# Patient Record
Sex: Female | Born: 1960
Health system: Southern US, Community
[De-identification: ages and names within clinical notes are randomized; demographics above are authoritative.]

## PROBLEM LIST (undated history)

## (undated) DIAGNOSIS — N649 Disorder of breast, unspecified: Secondary | ICD-10-CM

## (undated) DIAGNOSIS — F17200 Nicotine dependence, unspecified, uncomplicated: Secondary | ICD-10-CM

## (undated) DIAGNOSIS — T7840XA Allergy, unspecified, initial encounter: Secondary | ICD-10-CM

## (undated) DIAGNOSIS — R079 Chest pain, unspecified: Secondary | ICD-10-CM

## (undated) DIAGNOSIS — I4510 Unspecified right bundle-branch block: Secondary | ICD-10-CM

## (undated) DIAGNOSIS — R0683 Snoring: Secondary | ICD-10-CM

## (undated) DIAGNOSIS — E782 Mixed hyperlipidemia: Secondary | ICD-10-CM

## (undated) HISTORY — DX: Nicotine dependence, unspecified, uncomplicated: F17.200

## (undated) HISTORY — DX: Disorder of breast, unspecified: N64.9

## (undated) HISTORY — DX: Snoring: R06.83

## (undated) HISTORY — DX: Mixed hyperlipidemia: E78.2

## (undated) HISTORY — DX: Allergy, unspecified, initial encounter: T78.40XA

## (undated) HISTORY — PX: OTHER SURGICAL HISTORY: SHX169

## (undated) HISTORY — DX: Chest pain, unspecified: R07.9

---

## 2012-04-30 ENCOUNTER — Encounter (HOSPITAL_BASED_OUTPATIENT_CLINIC_OR_DEPARTMENT_OTHER): Payer: Self-pay | Admitting: *Deleted

## 2012-04-30 ENCOUNTER — Emergency Department (HOSPITAL_BASED_OUTPATIENT_CLINIC_OR_DEPARTMENT_OTHER)
Admission: EM | Admit: 2012-04-30 | Discharge: 2012-04-30 | Disposition: A | Discharge status: Home / Self Care | Payer: BC Managed Care – PPO | Attending: Emergency Medicine | Admitting: Emergency Medicine

## 2012-04-30 ENCOUNTER — Emergency Department (HOSPITAL_BASED_OUTPATIENT_CLINIC_OR_DEPARTMENT_OTHER): Payer: BC Managed Care – PPO

## 2012-04-30 DIAGNOSIS — S92919A Unspecified fracture of unspecified toe(s), initial encounter for closed fracture: Secondary | ICD-10-CM | POA: Insufficient documentation

## 2012-04-30 DIAGNOSIS — Y92009 Unspecified place in unspecified non-institutional (private) residence as the place of occurrence of the external cause: Secondary | ICD-10-CM | POA: Insufficient documentation

## 2012-04-30 DIAGNOSIS — F17200 Nicotine dependence, unspecified, uncomplicated: Secondary | ICD-10-CM | POA: Insufficient documentation

## 2012-04-30 DIAGNOSIS — Y9389 Activity, other specified: Secondary | ICD-10-CM | POA: Insufficient documentation

## 2012-04-30 DIAGNOSIS — W2203XA Walked into furniture, initial encounter: Secondary | ICD-10-CM | POA: Insufficient documentation

## 2012-04-30 DIAGNOSIS — I4510 Unspecified right bundle-branch block: Secondary | ICD-10-CM | POA: Insufficient documentation

## 2012-04-30 HISTORY — DX: Unspecified right bundle-branch block: I45.10

## 2012-04-30 MED ORDER — HYDROCODONE-ACETAMINOPHEN 5-325 MG PO TABS
1.0000 | ORAL_TABLET | Freq: Once | ORAL | Status: AC
  Administered 2012-04-30: 1 via ORAL
  Filled 2012-04-30: qty 1

## 2012-04-30 MED ORDER — HYDROCODONE-ACETAMINOPHEN 5-325 MG PO TABS: 1.0000 | ORAL_TABLET | Freq: Four times a day (QID) | ORAL | Status: DC | PRN

## 2012-04-30 NOTE — ED Provider Notes (Signed)
History     CSN: [id number]  Arrival date & time 04/30/12  0610   First MD Initiated Contact with Patient 04/30/12 [phone]      Chief Complaint  Patient presents with  . Toe Injury    (Consider location/radiation/quality/duration/timing/severity/associated sxs/prior treatment) HPI This is a 51 year old female who got a bed this morning to go work and accidentally kicked a foot stool. There was immediate, severe pain in her left fourth toe which is now angled lateral to its natural position. She had transient lightheadedness and diaphoresis due to the pain. There is still moderate pain in that toe and on the base of the foot proximal to that toe, worse with movement or palpation. She denies other injury.  Past Medical History  Diagnosis Date  . Right bundle branch block     Past Surgical History  Procedure Date  . Pylonidal cystectomy     No family history on file.  History  Substance Use Topics  . Smoking status: Current Some Day Smoker    Types: Cigarettes  . Smokeless tobacco: Never Used  . Alcohol Use: Yes     1 beer once a month    OB History    Grav Para Term Preterm Abortions TAB SAB Ect Mult Living                  Review of Systems  All other systems reviewed and are negative.    Allergies  Penicillins  Home Medications  No current outpatient prescriptions on file.  BP 130/73  Pulse 73  Temp 98 F (36.7 C) (Oral)  Resp 16  Ht 5\' 6"  (1.676 m)  Wt 145 lb (65.772 kg)  BMI 23.40 kg/m2  SpO2 100%  Physical Exam General: Well-developed, well-nourished female in no acute distress; appearance consistent with age of record HENT: normocephalic, atraumatic Eyes: pupils equal round and reactive to light; extraocular muscles intact Neck: supple Heart: regular rate and rhythm Lungs: clear to auscultation bilaterally Abdomen: soft; nondistended; nontender; bowel sounds present Extremities: Deformity of left fourth toe, angled laterally; sensation of  distal left fourth toe intact but altered; brisk capillary refill distal left fourth toe; tenderness of left fourth toe and plantar left foot proximal left fourth toe; pulses normal Neurologic: Awake, alert and oriented; motor function intact in all extremities and symmetric; no facial droop Skin: Warm and dry Psychiatric: Normal mood and affect    ED Course  Procedures (including critical care time)  CLOSED REDUCTION The patient's left fourth toe proximal phalanx fracture was reduced with traction to near anatomic alignment. The toe was then buddy taped to the left third toe. The patient tolerated this well and there were no immediate complications.   MDM  Nursing notes and vitals signs, including pulse oximetry, reviewed.  Summary of this visit's results, reviewed by myself:   Imaging Studies: Dg Foot Complete Left  04/30/2012  *RADIOLOGY REPORT*  Clinical Data: Injury, pain.  LEFT FOOT - COMPLETE 3+ VIEW  Comparison: None.  Findings: The patient has a fracture through the proximal diaphysis of the proximal phalanx of the fourth toe with lateral angulation. No other acute bony or joint abnormality is identified. Sclerotic lesion in the distal phalanx of the great toe has benign features and may be a bone island.  IMPRESSION: Fracture proximal phalanx of the fourth toe.   Original Report Authenticated By: Bernadene Bell. Maricela Curet, M.D.             Hanley Seamen, MD 04/30/12  0653 

## 2012-04-30 NOTE — ED Notes (Signed)
Pt states she got out of bed to go to work and hit left foot on leg of a foot stool. 4 digit appears misaligned with other toes and is red in appearance. Pt reports foot pain at base of toes and on ball of foot. Pt reports pain 8/10 at this time. Good pedal pulse and capillary refill.

## 2013-02-27 ENCOUNTER — Ambulatory Visit (HOSPITAL_BASED_OUTPATIENT_CLINIC_OR_DEPARTMENT_OTHER)
Admission: RE | Admit: 2013-02-27 | Discharge: 2013-02-27 | Disposition: A | Discharge status: Home / Self Care | Payer: BC Managed Care – PPO | Source: Ambulatory Visit | Attending: Physician Assistant | Admitting: Physician Assistant

## 2013-02-27 ENCOUNTER — Ambulatory Visit (INDEPENDENT_AMBULATORY_CARE_PROVIDER_SITE_OTHER): Payer: BC Managed Care – PPO | Admitting: Physician Assistant

## 2013-02-27 ENCOUNTER — Encounter: Payer: Self-pay | Admitting: Physician Assistant

## 2013-02-27 VITALS — BP 124/84 | HR 76 | Temp 97.8°F | Resp 16 | Ht 66.0 in | Wt 151.2 lb

## 2013-02-27 DIAGNOSIS — R350 Frequency of micturition: Secondary | ICD-10-CM

## 2013-02-27 DIAGNOSIS — M79609 Pain in unspecified limb: Secondary | ICD-10-CM | POA: Insufficient documentation

## 2013-02-27 DIAGNOSIS — M5412 Radiculopathy, cervical region: Secondary | ICD-10-CM

## 2013-02-27 DIAGNOSIS — R200 Anesthesia of skin: Secondary | ICD-10-CM

## 2013-02-27 DIAGNOSIS — M542 Cervicalgia: Secondary | ICD-10-CM | POA: Insufficient documentation

## 2013-02-27 DIAGNOSIS — R202 Paresthesia of skin: Secondary | ICD-10-CM

## 2013-02-27 DIAGNOSIS — M25519 Pain in unspecified shoulder: Secondary | ICD-10-CM | POA: Insufficient documentation

## 2013-02-27 DIAGNOSIS — R209 Unspecified disturbances of skin sensation: Secondary | ICD-10-CM

## 2013-02-27 DIAGNOSIS — Z8679 Personal history of other diseases of the circulatory system: Secondary | ICD-10-CM

## 2013-02-27 DIAGNOSIS — Z1239 Encounter for other screening for malignant neoplasm of breast: Secondary | ICD-10-CM | POA: Insufficient documentation

## 2013-02-27 DIAGNOSIS — Z0000 Encounter for general adult medical examination without abnormal findings: Secondary | ICD-10-CM

## 2013-02-27 MED ORDER — NITROGLYCERIN 0.4 MG SL SUBL: 0.4000 mg | SUBLINGUAL_TABLET | SUBLINGUAL | Status: DC | PRN

## 2013-02-27 NOTE — Assessment & Plan Note (Addendum)
EKG today shows NSR with rate of 69, with no abnormalities.  BP, pulse and O2 saturation all great at today's visit.  Refill Nitro SL.  Obtained signature for medical record release from PCP in [place] and Development worker, international aid in [place].  Patient educated on red flag symptoms and when to present to the ED.

## 2013-02-27 NOTE — Assessment & Plan Note (Signed)
Will check UA with microscopy, BMP and A1C today.

## 2013-02-27 NOTE — Assessment & Plan Note (Signed)
Xray cervical spine.  Encourage ibuprofen/aleve for symptoms.  Topical aspercreme to neck.  Patient to return in 2 weeks if symptoms are persisting.

## 2013-02-27 NOTE — Patient Instructions (Addendum)
Please obtain fasting labs.  We will call you with the results.  Please continue taking anaprox for left arm pain.  Also consider topical aspercreme to neck and shoulders.  Try to avoid overexertion of left shoulder.  If symptoms persist, we will need to reassess and do a further workup.  I want to see you in 3 weeks   Cervical Radiculopathy Cervical radiculopathy happens when a nerve in the neck is pinched or bruised by a slipped (herniated) disk or by arthritic changes in the bones of the cervical spine. This can occur due to an injury or as part of the normal aging process. Pressure on the cervical nerves can cause pain or numbness that runs from your neck all the way down into your arm and fingers. CAUSES  There are many possible causes, including:  Injury.  Muscle tightness in the neck from overuse.  Swollen, painful joints (arthritis).  Breakdown or degeneration in the bones and joints of the spine (spondylosis) due to aging.  Bone spurs that may develop near the cervical nerves. SYMPTOMS  Symptoms include pain, weakness, or numbness in the affected arm and hand. Pain can be severe or irritating. Symptoms may be worse when extending or turning the neck. DIAGNOSIS  Your caregiver will ask about your symptoms and do a physical exam. He or she may test your strength and reflexes. X-rays, CT scans, and MRI scans may be needed in cases of injury or if the symptoms do not go away after a period of time. Electromyography (EMG) or nerve conduction testing may be done to study how your nerves and muscles are working. TREATMENT  Your caregiver may recommend certain exercises to help relieve your symptoms. Cervical radiculopathy can, and often does, get better with time and treatment. If your problems continue, treatment options may include:  Wearing a soft collar for short periods of time.  Physical therapy to strengthen the neck muscles.  Medicines, such as nonsteroidal anti-inflammatory  drugs (NSAIDs), oral corticosteroids, or spinal injections.  Surgery. Different types of surgery may be done depending on the cause of your problems. HOME CARE INSTRUCTIONS   Put ice on the affected area.  Put ice in a plastic bag.  Place a towel between your skin and the bag.  Leave the ice on for 15-20 minutes, 3-4 times a day or as directed by your caregiver.  If ice does not help, you can try using heat. Take a warm shower or bath, or use a hot water bottle as directed by your caregiver.  You may try a gentle neck and shoulder massage.  Use a flat pillow when you sleep.  Only take over-the-counter or prescription medicines for pain, discomfort, or fever as directed by your caregiver.  If physical therapy was prescribed, follow your caregiver's directions.  If a soft collar was prescribed, use it as directed. SEEK IMMEDIATE MEDICAL CARE IF:   Your pain gets much worse and cannot be controlled with medicines.  You have weakness or numbness in your hand, arm, face, or leg.  You have a high fever or a stiff, rigid neck.  You lose bowel or bladder control (incontinence).  You have trouble with walking, balance, or speaking. MAKE SURE YOU:   Understand these instructions.  Will watch your condition.  Will get help right away if you are not doing well or get worse. Document Released: 03/14/2001 Document Revised: 09/11/2011 Document Reviewed: 01/31/2011 [facility] Patient Information 2014 [place], [place].

## 2013-02-27 NOTE — Progress Notes (Signed)
Patient ID: Molly Snyder, female   DOB: April 10, 1961, 52 y.o.   MRN: [id number]   Patient presents to clinic today to establish care.  Information was obtained from the patient.    Acute Concerns:   Patient c/o pain in left shoulder and arm x 2 weeks, described as a shooting pain that travels from neck down arm, with occasional tingling/numbness.  Endorses some fatigue.  Patient endorses occupation involving intense physical labor.  Patient denies similar symptoms of RUE.  Patient has history of multiple slipped discs of cervical vertebra.  Patient also endorses history of RBBB on prior EKG and "angina" diagnosed by cardiologist 5 years ago.  Patient states cardiology performed a full work-up including echocardiogram and heart catheterization.  No there abnormalities found, according to patient.  Denies history of MI, CVA or TIA.  Denies hx of hypertension.  States she has a history of high cholesterol but denies ever taking medication for this.  Denies overt chest pain, but endorses some palpations, which she thinks are attributable to concern over neck and arm pain.  Has nitroglycerin SL for her history of angina.  Has taken one of these for her pain, but states it does not relieve pain.  Patient denies history of diabetes but does endorse urinary frequency.  Denies urgency, hematuria, flank pain, dysuria, foul-smelling urine.  Has taken some aleve for her arm pain with some relief of symptoms.  No other concerns at present.  Chronic Issues: Patient endorses history of hyperlipidemia in the past.  Endorses history of RBBB and Angina.  Last work-up 5 years ago.  Health Maintenance: Eyes -- No exam in several years.   Dental -- No exam in several years PAP -- last exam 3+ years ago.  Denies history of abnormal pap smear. Mammography -- unsure of mammograms Immunizations -- patient states she is up-to-date on shots. Last tetanus was 3 years ago.  Past Medical History  Diagnosis Date  . Right  bundle branch block     No current outpatient prescriptions on file prior to visit.   No current facility-administered medications on file prior to visit.    Allergies  Allergen Reactions  . Penicillins Anaphylaxis    History reviewed. No pertinent family history.  History   Social History  . Marital Status: Single    Spouse Name: N/A    Number of Children: N/A  . Years of Education: N/A   Social History Main Topics  . Smoking status: Current Some Day Smoker    Types: Cigarettes  . Smokeless tobacco: Never Used  . Alcohol Use: Yes     Comment: 1 beer once a month  . Drug Use: No  . Sexual Activity: Yes    Birth Control/ Protection: None   Other Topics Concern  . None   Social History Narrative  . None    Review of Systems  Constitutional: Negative for fever, chills, weight loss and malaise/fatigue.  HENT: Positive for neck pain. Negative for hearing loss, ear pain, tinnitus and ear discharge.   Eyes: Positive for blurred vision. Negative for double vision, photophobia and pain.  Respiratory: Negative for cough, shortness of breath and wheezing.   Cardiovascular: Positive for palpitations. Negative for chest pain.  Gastrointestinal: Positive for heartburn and nausea. Negative for vomiting, abdominal pain, diarrhea, constipation and blood in stool.  Genitourinary: Positive for frequency. Negative for dysuria, urgency, hematuria and flank pain.  Musculoskeletal: Positive for back pain. Negative for myalgias and joint pain.  Neurological: Positive for  tingling and headaches. Negative for seizures and loss of consciousness.  Psychiatric/Behavioral: Negative for depression, suicidal ideas and substance abuse. The patient is nervous/anxious.    Filed Vitals:   02/27/13 0950  BP: 124/84  Pulse: 76  Temp: 97.8 F (36.6 C)  Resp: 16   Physical Exam  Vitals reviewed. Constitutional: She is oriented to person, place, and time and well-developed, well-nourished, and  in no distress.  HENT:  Head: Normocephalic and atraumatic.  Right Ear: External ear normal.  Left Ear: External ear normal.  Nose: Nose normal.  Mouth/Throat: Oropharynx is clear and moist. No oropharyngeal exudate.  TM WNL bilaterally  Eyes: Conjunctivae and EOM are normal. Pupils are equal, round, and reactive to light.  Neck: Normal range of motion. Neck supple. No thyromegaly present.  Cardiovascular: Normal rate, regular rhythm, normal heart sounds and intact distal pulses.   Pulmonary/Chest: Effort normal and breath sounds normal. No respiratory distress. She has no wheezes. She has no rales. She exhibits no tenderness.  Abdominal: Soft. Bowel sounds are normal. She exhibits no distension and no mass. There is no tenderness. There is no rebound and no guarding.  Musculoskeletal: She exhibits tenderness.  Internal rotation of R shoulder limited 2/2 prior surgery.  ROM and strength of RUE otherwise normal. ROM reproduces pain. No bony tenderness or abnormality.    Lymphadenopathy:    She has no cervical adenopathy.  Neurological: She is alert and oriented to person, place, and time. She has normal reflexes. No cranial nerve deficit.  Skin: Skin is warm and dry. No rash noted.   Diagnostics: EKG --  NSR with rate of 69; no STEMI or other abnormalities  Assessment/Plan: History of angina EKG today shows NSR with rate of 69, with no abnormalities.  BP, pulse and O2 saturation all great at today's visit.  Refill Nitro SL.  Obtained signature for medical record release from PCP in [place] and Development worker, international aid in [place].  Patient educated on red flag symptoms and when to present to the ED.  Visit for preventive health examination Patient overdue for mammography, PAP smear, dental and eye examination.  Patient educated on importance of these measures.  Plans to have these in the future.  Wishes to have [place] handle her gynecological care.  Obtain fasting labs at today's visit.  Obtain medical records  from prior PCP and Cardiologist.  Left cervical radiculopathy Xray cervical spine.  Encourage ibuprofen/aleve for symptoms.  Topical aspercreme to neck.  Patient to return in 2 weeks if symptoms are persisting.  Urinary frequency Will check UA with microscopy, BMP and A1C today.

## 2013-02-27 NOTE — Assessment & Plan Note (Addendum)
Patient overdue for mammography, PAP smear, dental and eye examination.  Patient educated on importance of these measures.  Plans to have these in the future.  Wishes to have [place] handle her gynecological care.  Obtain fasting labs at today's visit.  Obtain medical records from prior PCP and Cardiologist.

## 2013-02-28 ENCOUNTER — Other Ambulatory Visit: Payer: Self-pay | Admitting: Physician Assistant

## 2013-02-28 ENCOUNTER — Telehealth: Payer: Self-pay | Admitting: *Deleted

## 2013-02-28 ENCOUNTER — Ambulatory Visit: Payer: BC Managed Care – PPO | Admitting: Family

## 2013-02-28 DIAGNOSIS — E785 Hyperlipidemia, unspecified: Secondary | ICD-10-CM

## 2013-02-28 LAB — CBC WITH DIFFERENTIAL/PLATELET
Basophils Absolute: 0.1 10*3/uL (ref 0.0–0.1)
Basophils Relative: 1 % (ref 0–1)
Eosinophils Absolute: 0.3 10*3/uL (ref 0.0–0.7)
Eosinophils Relative: 3 % (ref 0–5)
Lymphocytes Relative: 30 % (ref 12–46)
MCH: 29.2 pg (ref 26.0–34.0)
MCHC: 33.2 g/dL (ref 30.0–36.0)
MCV: 88.1 fL (ref 78.0–100.0)
Platelets: 409 10*3/uL — ABNORMAL HIGH (ref 150–400)
RDW: 13.9 % (ref 11.5–15.5)
WBC: 9.4 10*3/uL (ref 4.0–10.5)

## 2013-02-28 LAB — URINALYSIS, ROUTINE W REFLEX MICROSCOPIC
Glucose, UA: NEGATIVE mg/dL
Hgb urine dipstick: NEGATIVE
Ketones, ur: NEGATIVE mg/dL
Leukocytes, UA: NEGATIVE
Nitrite: NEGATIVE
Specific Gravity, Urine: 1.006 (ref 1.005–1.030)
pH: 6.5 (ref 5.0–8.0)

## 2013-02-28 LAB — LIPID PANEL
Cholesterol: 321 mg/dL — ABNORMAL HIGH (ref 0–200)
Total CHOL/HDL Ratio: 5.8 Ratio
Triglycerides: 198 mg/dL — ABNORMAL HIGH (ref <150)
VLDL: 40 mg/dL (ref 0–40)

## 2013-02-28 LAB — HEPATIC FUNCTION PANEL
Bilirubin, Direct: 0.1 mg/dL (ref 0.0–0.3)
Indirect Bilirubin: 0.3 mg/dL (ref 0.0–0.9)
Total Bilirubin: 0.4 mg/dL (ref 0.3–1.2)

## 2013-02-28 LAB — BASIC METABOLIC PANEL
BUN: 15 mg/dL (ref 6–23)
Calcium: 9.9 mg/dL (ref 8.4–10.5)
Creat: 0.76 mg/dL (ref 0.50–1.10)

## 2013-02-28 LAB — TSH: TSH: 0.365 u[IU]/mL (ref 0.350–4.500)

## 2013-02-28 MED ORDER — ATORVASTATIN CALCIUM 20 MG PO TABS: 20.0000 mg | ORAL_TABLET | Freq: Every day | ORAL | Status: DC

## 2013-02-28 NOTE — Telephone Encounter (Signed)
Notified pt of instructions below. She states her "cholesterol is genetic and she is not going to take cholesterol medication". States "they have wanted me on cholesterol medication since I was 17". Reports that she eats" healthy, limits fried foods, no sweets, junk or snacks and has a very physical job. Pt voices understanding of results and will call for appt if pain continues after a couple of weeks.

## 2013-02-28 NOTE — Telephone Encounter (Signed)
LMOM with contact name and number for return call RE: results and further provider instructions/SLS  

## 2013-02-28 NOTE — Telephone Encounter (Signed)
Message copied by Regis Bill on Fri Feb 28, 2013 11:57 AM ------      Message from: Marcelline Mates      Created: Fri Feb 28, 2013  8:07 AM       Please inform patient that her lab results are in.  Overall labs look good.  However, her LDL (lousy) cholesterol is high and therefore we need to start medication for this as well as lifestyle changes.  For medication, I want to start atorvastatin (lipitor) 20 mg daily.  Most common side effects of this type of medication that she should look out for are muscle aches, joint aches and headaches.  She also needs to implement diet/exercise to her daily life.  Dietary recommendations include an intake of saturated fat <7% of total daily calories, smoking cessation, and exercise of at least 30 minutes a few times each week. The intake of cholesterol from food should be limited to <300 mg/day.  I will need to see her again in 1-2 months from starting her medication to recheck her cholesterol and liver function to see if we need to make a dose increase.  It is very important that we keep her cholesterol under control with her history of angina as high cholesterol increases her risk of      heart attack and stroke.            Also, please inform patient that her X-ray did not show any major degenerative changes to the bones in her neck.  She is to continue our current plan for pain relief.  If symptoms persist past a couple of weeks, then she should schedule a return visit, as further imaging may be needed. ------

## 2013-03-04 NOTE — Telephone Encounter (Deleted)
Called patient personally to

## 2013-03-04 NOTE — Telephone Encounter (Signed)
Patient was educated at visit on the importance of cholesterol and blood pressure management in the prevention of heart attack and stroke.  Patient endorsed understanding and was amenable to therapy if she had abnormal lab results.  Patient is aware of her family history and personal history of angina.  She currently refuses medication for her cholesterol.  Patient is a 52 year-old adult with adequate decision-making capacity.  I will attempt to persuade her to start therapy at her next visit(s).

## 2013-03-20 ENCOUNTER — Ambulatory Visit: Payer: BC Managed Care – PPO | Admitting: Physician Assistant

## 2014-09-01 ENCOUNTER — Ambulatory Visit (INDEPENDENT_AMBULATORY_CARE_PROVIDER_SITE_OTHER): Payer: BLUE CROSS/BLUE SHIELD | Admitting: Family Medicine

## 2014-09-01 ENCOUNTER — Encounter: Payer: Self-pay | Admitting: Family Medicine

## 2014-09-01 VITALS — BP 124/80 | HR 91 | Temp 98.3°F | Ht 66.0 in | Wt 151.2 lb

## 2014-09-01 DIAGNOSIS — R079 Chest pain, unspecified: Secondary | ICD-10-CM

## 2014-09-01 DIAGNOSIS — N649 Disorder of breast, unspecified: Secondary | ICD-10-CM

## 2014-09-01 DIAGNOSIS — F17200 Nicotine dependence, unspecified, uncomplicated: Secondary | ICD-10-CM

## 2014-09-01 DIAGNOSIS — Z720 Tobacco use: Secondary | ICD-10-CM

## 2014-09-01 DIAGNOSIS — G4730 Sleep apnea, unspecified: Secondary | ICD-10-CM

## 2014-09-01 DIAGNOSIS — F329 Major depressive disorder, single episode, unspecified: Secondary | ICD-10-CM

## 2014-09-01 DIAGNOSIS — R002 Palpitations: Secondary | ICD-10-CM

## 2014-09-01 DIAGNOSIS — F32A Depression, unspecified: Secondary | ICD-10-CM

## 2014-09-01 DIAGNOSIS — R0683 Snoring: Secondary | ICD-10-CM

## 2014-09-01 DIAGNOSIS — F419 Anxiety disorder, unspecified: Secondary | ICD-10-CM

## 2014-09-01 DIAGNOSIS — R5382 Chronic fatigue, unspecified: Secondary | ICD-10-CM

## 2014-09-01 DIAGNOSIS — R519 Headache, unspecified: Secondary | ICD-10-CM

## 2014-09-01 DIAGNOSIS — E782 Mixed hyperlipidemia: Secondary | ICD-10-CM

## 2014-09-01 DIAGNOSIS — F418 Other specified anxiety disorders: Secondary | ICD-10-CM

## 2014-09-01 DIAGNOSIS — R51 Headache: Secondary | ICD-10-CM

## 2014-09-01 NOTE — Progress Notes (Signed)
Pre visit review using our clinic review tool, if applicable. No additional management support is needed unless otherwise documented below in the visit note. 

## 2014-09-01 NOTE — Patient Instructions (Signed)
Sleep Apnea  Sleep apnea is a sleep disorder characterized by abnormal pauses in breathing while you sleep. When your breathing pauses, the level of oxygen in your blood decreases. This causes you to move out of deep sleep and into light sleep. As a result, your quality of sleep is poor, and the system that carries your blood throughout your body (cardiovascular system) experiences stress. If sleep apnea remains untreated, the following conditions can develop:  High blood pressure (hypertension).  Coronary artery disease.  Inability to achieve or maintain an erection (impotence).  Impairment of your thought process (cognitive dysfunction). There are three types of sleep apnea: 1. Obstructive sleep apnea--Pauses in breathing during sleep because of a blocked airway. 2. Central sleep apnea--Pauses in breathing during sleep because the area of the brain that controls your breathing does not send the correct signals to the muscles that control breathing. 3. Mixed sleep apnea--A combination of both obstructive and central sleep apnea. RISK FACTORS The following risk factors can increase your risk of developing sleep apnea:  Being overweight.  Smoking.  Having narrow passages in your nose and throat.  Being of older age.  Being female.  Alcohol use.  Sedative and tranquilizer use.  Ethnicity. Among individuals younger than 35 years, African Americans are at increased risk of sleep apnea. SYMPTOMS   Difficulty staying asleep.  Daytime sleepiness and fatigue.  Loss of energy.  Irritability.  Loud, heavy snoring.  Morning headaches.  Trouble concentrating.  Forgetfulness.  Decreased interest in sex. DIAGNOSIS  In order to diagnose sleep apnea, your caregiver will perform a physical examination. Your caregiver may suggest that you take a home sleep test. Your caregiver may also recommend that you spend the night in a sleep lab. In the sleep lab, several monitors record  information about your heart, lungs, and brain while you sleep. Your leg and arm movements and blood oxygen level are also recorded. TREATMENT The following actions may help to resolve mild sleep apnea:  Sleeping on your side.   Using a decongestant if you have nasal congestion.   Avoiding the use of depressants, including alcohol, sedatives, and narcotics.   Losing weight and modifying your diet if you are overweight. There also are devices and treatments to help open your airway:  Oral appliances. These are custom-made mouthpieces that shift your lower jaw forward and slightly open your bite. This opens your airway.  Devices that create positive airway pressure. This positive pressure "splints" your airway open to help you breathe better during sleep. The following devices create positive airway pressure:  Continuous positive airway pressure (CPAP) device. The CPAP device creates a continuous level of air pressure with an air pump. The air is delivered to your airway through a mask while you sleep. This continuous pressure keeps your airway open.  Nasal expiratory positive airway pressure (EPAP) device. The EPAP device creates positive air pressure as you exhale. The device consists of single-use valves, which are inserted into each nostril and held in place by adhesive. The valves create very little resistance when you inhale but create much more resistance when you exhale. That increased resistance creates the positive airway pressure. This positive pressure while you exhale keeps your airway open, making it easier to breath when you inhale again.  Bilevel positive airway pressure (BPAP) device. The BPAP device is used mainly in patients with central sleep apnea. This device is similar to the CPAP device because it also uses an air pump to deliver continuous air pressure   through a mask. However, with the BPAP machine, the pressure is set at two different levels. The pressure when you  exhale is lower than the pressure when you inhale.  Surgery. Typically, surgery is only done if you cannot comply with less invasive treatments or if the less invasive treatments do not improve your condition. Surgery involves removing excess tissue in your airway to create a wider passage way. Document Released: 06/09/2002 Document Revised: 10/14/2012 Document Reviewed: 10/26/2011 [employer] Patient Information 2015 [employer]. This information is not intended to replace advice given to you by your health care provider. Make sure you discuss any questions you have with your health care provider.  

## 2014-09-02 ENCOUNTER — Other Ambulatory Visit (INDEPENDENT_AMBULATORY_CARE_PROVIDER_SITE_OTHER): Payer: BLUE CROSS/BLUE SHIELD

## 2014-09-02 ENCOUNTER — Other Ambulatory Visit: Payer: Self-pay | Admitting: Family Medicine

## 2014-09-02 ENCOUNTER — Encounter: Payer: Self-pay | Admitting: Family Medicine

## 2014-09-02 DIAGNOSIS — N649 Disorder of breast, unspecified: Secondary | ICD-10-CM

## 2014-09-02 DIAGNOSIS — R079 Chest pain, unspecified: Secondary | ICD-10-CM

## 2014-09-02 DIAGNOSIS — R0683 Snoring: Secondary | ICD-10-CM

## 2014-09-02 DIAGNOSIS — R519 Headache, unspecified: Secondary | ICD-10-CM

## 2014-09-02 DIAGNOSIS — R002 Palpitations: Secondary | ICD-10-CM

## 2014-09-02 DIAGNOSIS — F17200 Nicotine dependence, unspecified, uncomplicated: Secondary | ICD-10-CM

## 2014-09-02 DIAGNOSIS — F329 Major depressive disorder, single episode, unspecified: Secondary | ICD-10-CM | POA: Insufficient documentation

## 2014-09-02 DIAGNOSIS — E782 Mixed hyperlipidemia: Secondary | ICD-10-CM

## 2014-09-02 DIAGNOSIS — G4730 Sleep apnea, unspecified: Secondary | ICD-10-CM

## 2014-09-02 DIAGNOSIS — I2510 Atherosclerotic heart disease of native coronary artery without angina pectoris: Secondary | ICD-10-CM | POA: Insufficient documentation

## 2014-09-02 DIAGNOSIS — F419 Anxiety disorder, unspecified: Secondary | ICD-10-CM

## 2014-09-02 DIAGNOSIS — R51 Headache: Secondary | ICD-10-CM

## 2014-09-02 DIAGNOSIS — F32A Depression, unspecified: Secondary | ICD-10-CM | POA: Insufficient documentation

## 2014-09-02 DIAGNOSIS — R5382 Chronic fatigue, unspecified: Secondary | ICD-10-CM

## 2014-09-02 DIAGNOSIS — E785 Hyperlipidemia, unspecified: Secondary | ICD-10-CM

## 2014-09-02 HISTORY — DX: Nicotine dependence, unspecified, uncomplicated: F17.200

## 2014-09-02 HISTORY — DX: Snoring: R06.83

## 2014-09-02 HISTORY — DX: Disorder of breast, unspecified: N64.9

## 2014-09-02 HISTORY — DX: Chest pain, unspecified: R07.9

## 2014-09-02 LAB — CBC
HCT: 40.5 % (ref 36.0–46.0)
Hemoglobin: 13.7 g/dL (ref 12.0–15.0)
MCHC: 33.8 g/dL (ref 30.0–36.0)
MCV: 88.4 fl (ref 78.0–100.0)
Platelets: 368 10*3/uL (ref 150.0–400.0)
RBC: 4.58 Mil/uL (ref 3.87–5.11)
RDW: 14.3 % (ref 11.5–15.5)
WBC: 8.7 10*3/uL (ref 4.0–10.5)

## 2014-09-02 LAB — LIPID PANEL
CHOL/HDL RATIO: 5
Cholesterol: 288 mg/dL — ABNORMAL HIGH (ref 0–200)
HDL: 56.8 mg/dL (ref >39.00)
LDL CALC: 209 mg/dL — AB (ref 0–99)
NONHDL: 231.2
Triglycerides: 112 mg/dL (ref 0.0–149.0)
VLDL: 22.4 mg/dL (ref 0.0–40.0)

## 2014-09-02 LAB — TSH: TSH: 0.8 u[IU]/mL (ref 0.35–4.50)

## 2014-09-02 NOTE — Assessment & Plan Note (Signed)
Has not had a MGM for greater than 10 years and is noting some swelling and pain in left axillae, is set up for mgm.

## 2014-09-02 NOTE — Assessment & Plan Note (Signed)
Encouraged complete cessation. Discussed need to quit as relates to risk of numerous cancers, cardiac and pulmonary disease as well as neurologic complications. Counseled for greater than 3 minutes 

## 2014-09-02 NOTE — Assessment & Plan Note (Addendum)
She reports witnessed apnea, heavy snoring, poor and restless sleep. She also notes excessive fatigue and headaches. Is set up for sleep study

## 2014-09-02 NOTE — Assessment & Plan Note (Addendum)
Has noted an escalation of palpitations and chest pain recently. Is referred to cardiology for further consideration and start an 81 mg ECASA daily

## 2014-09-02 NOTE — Assessment & Plan Note (Signed)
Declines meds, denies suicidal ideation.

## 2014-09-02 NOTE — Progress Notes (Signed)
Molly Snyder  [id number] Jul 10, 1960 09/02/2014      Progress Note-Follow Up  Subjective  Chief Complaint  Chief Complaint  Patient presents with  . Palpitations  . Fatigue  . Headache    HPI  Patient is a 54 y.o. female in today for routine medical care. Patient in today with numerous complaints. She has not been in in quite some time, well over a year and has only been seen in our off ice once previously. Does acknowledge a great deal of stress with changes in her work situation. 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 She is noting some increase in palpitations and chest pain as a result. Is also complaining of fullness in her left axilla. Has not had a mammogram in roughly 15 years. Continues to smoke. Denies any recent illness.  Denies CP/palp/SOB/HA/congestion/fevers/GI or GU c/o. Taking meds as prescribed Past Medical History  Diagnosis Date  . Right bundle branch block   . Breast lesion 09/02/2014    Left axillae   . Chest pain 09/02/2014  . Snoring 09/02/2014  . Tobacco use disorder 09/02/2014    Past Surgical History  Procedure Laterality Date  . Pylonidal cystectomy      History reviewed. No pertinent family history.  History   Social History  . Marital Status: Single    Spouse Name: N/A  . Number of Children: N/A  . Years of Education: N/A   Occupational History  . Not on file.   Social History Main Topics  . Smoking status: Current Some Day Smoker    Types: Cigarettes  . Smokeless tobacco: Never Used  . Alcohol Use: Yes     Comment: 1 beer once a month  . Drug Use: No  . Sexual Activity: Yes    Birth Control/ Protection: None   Other Topics Concern  . Not on file   Social History Narrative    Current Outpatient Prescriptions on File Prior to Visit  Medication Sig Dispense Refill  . loratadine (CLARITIN) 10 MG tablet Take 10 mg by mouth daily.    .  naproxen sodium (ANAPROX) 220 MG tablet Take 220 mg by mouth 2 (two) times daily with a meal.    . nitroGLYCERIN (NITROSTAT) 0.4 MG SL tablet Place 1 tablet (0.4 mg total) under the tongue every 5 (five) minutes as needed for chest pain. 30 tablet 0   No current facility-administered medications on file prior to visit.    Allergies  Allergen Reactions  . Penicillins Anaphylaxis    Review of Systems  Review of Systems  Constitutional: Negative for fever and malaise/fatigue.  HENT: Negative for congestion.   Eyes: Negative for discharge.  Respiratory: Negative for shortness of breath.   Cardiovascular: Negative for chest pain, palpitations and leg swelling.  Gastrointestinal: Negative for nausea, abdominal pain and diarrhea.  Genitourinary: Negative for dysuria.  Musculoskeletal: Positive for joint pain. Negative for falls.  Skin: Negative for rash.  Neurological: Negative for loss of consciousness and headaches.  Endo/Heme/Allergies: Negative for polydipsia.  Psychiatric/Behavioral: Negative for depression and suicidal ideas. The patient is not nervous/anxious and does not have insomnia.     Objective  BP 124/80 mmHg  Pulse 91  Temp(Src) 98.3 F (36.8 C) (Oral)  Ht 5\' 6"  (1.676 m)  Wt 151 lb 4 oz (68.607 kg)  BMI 24.42 kg/m2  SpO2 97%  Physical Exam  Physical Exam  Constitutional: She is oriented to person, place, and time and well-developed, well-nourished, and in no distress. No distress.  HENT:  Head: Normocephalic and atraumatic.  Right Ear: External ear normal.  Left Ear: External ear normal.  Nose: Nose normal.  Mouth/Throat: Oropharynx is clear and moist. No oropharyngeal exudate.  Eyes: Conjunctivae are normal. Pupils are equal, round, and reactive to light. Right eye exhibits no discharge. Left eye exhibits no discharge. No scleral icterus.  Neck: Normal range of motion. Neck supple. No thyromegaly present.  Cardiovascular: Normal rate, regular rhythm,  normal heart sounds and intact distal pulses.   No murmur heard. Pulmonary/Chest: Effort normal and breath sounds normal. No respiratory distress. She has no wheezes. She has no rales.  Abdominal: Soft. Bowel sounds are normal. She exhibits no distension and no mass. There is no tenderness.  Musculoskeletal: Normal range of motion. She exhibits no edema or tenderness.  Lymphadenopathy:    She has no cervical adenopathy.  Neurological: She is alert and oriented to person, place, and time. She has normal reflexes. No cranial nerve deficit. Coordination normal.  Skin: Skin is warm and dry. No rash noted. She is not diaphoretic.  Psychiatric: Mood, memory and affect normal.    Lab Results  Component Value Date   TSH 0.80 09/02/2014   Lab Results  Component Value Date   WBC 8.7 09/02/2014   HGB 13.7 09/02/2014   HCT 40.5 09/02/2014   MCV 88.4 09/02/2014   PLT 368.0 09/02/2014   Lab Results  Component Value Date   CREATININE 0.76 02/27/2013   BUN 15 02/27/2013   NA 138 02/27/2013   K 4.4 02/27/2013   CL 105 02/27/2013   CO2 27 02/27/2013   Lab Results  Component Value Date   ALT 11 02/27/2013   AST 13 02/27/2013   ALKPHOS 64 02/27/2013   BILITOT 0.4 02/27/2013   Lab Results  Component Value Date   CHOL 288* 09/02/2014   Lab Results  Component Value Date   HDL 56.80 09/02/2014   Lab Results  Component Value Date   LDLCALC 209* 09/02/2014   Lab Results  Component Value Date   TRIG 112.0 09/02/2014   Lab Results  Component Value Date   CHOLHDL 5 09/02/2014     Assessment & Plan  Snoring She reports witnessed apnea, heavy snoring, poor and restless sleep. She also notes excessive fatigue and headaches. Is set up for sleep study   Chest pain Has noted an escalation of palpitations and chest pain recently. Is referred to cardiology for further consideration and start an 81 mg ECASA daily   Breast lesion Has not had a MGM for greater than 10 years and is  noting some swelling and pain in left axillae, is set up for mgm.    Anxiety and depression Declines meds, denies suicidal ideation.   Tobacco use disorder Encouraged complete cessation. Discussed need to quit as relates to risk of numerous cancers, cardiac and pulmonary disease as well as neurologic complications. Counseled for greater than 3 minutes

## 2014-09-03 ENCOUNTER — Encounter: Payer: Self-pay | Admitting: Physician Assistant

## 2014-09-03 ENCOUNTER — Other Ambulatory Visit: Payer: Self-pay | Admitting: Physician Assistant

## 2014-09-03 DIAGNOSIS — E785 Hyperlipidemia, unspecified: Secondary | ICD-10-CM

## 2014-09-03 MED ORDER — SIMVASTATIN 10 MG PO TABS: 10.0000 mg | ORAL_TABLET | Freq: Every day | ORAL | Status: DC

## 2014-09-11 ENCOUNTER — Ambulatory Visit
Admission: RE | Admit: 2014-09-11 | Discharge: 2014-09-11 | Disposition: A | Discharge status: Home / Self Care | Payer: BLUE CROSS/BLUE SHIELD | Source: Ambulatory Visit | Attending: Family Medicine | Admitting: Family Medicine

## 2014-09-11 DIAGNOSIS — N649 Disorder of breast, unspecified: Secondary | ICD-10-CM

## 2014-09-15 ENCOUNTER — Other Ambulatory Visit (INDEPENDENT_AMBULATORY_CARE_PROVIDER_SITE_OTHER): Payer: BLUE CROSS/BLUE SHIELD

## 2014-09-15 DIAGNOSIS — R002 Palpitations: Secondary | ICD-10-CM

## 2014-09-15 DIAGNOSIS — R5383 Other fatigue: Secondary | ICD-10-CM

## 2014-09-15 DIAGNOSIS — E782 Mixed hyperlipidemia: Secondary | ICD-10-CM

## 2014-09-15 DIAGNOSIS — R079 Chest pain, unspecified: Secondary | ICD-10-CM

## 2014-09-15 LAB — COMPREHENSIVE METABOLIC PANEL
ALBUMIN: 4.4 g/dL (ref 3.5–5.2)
ALT: 15 U/L (ref 0–35)
AST: 14 U/L (ref 0–37)
Alkaline Phosphatase: 64 U/L (ref 39–117)
BILIRUBIN TOTAL: 0.4 mg/dL (ref 0.2–1.2)
BUN: 15 mg/dL (ref 6–23)
CHLORIDE: 107 meq/L (ref 96–112)
CO2: 29 meq/L (ref 19–32)
Calcium: 9.8 mg/dL (ref 8.4–10.5)
Creatinine, Ser: 0.82 mg/dL (ref 0.40–1.20)
GFR: 77.37 mL/min (ref >60.00)
Glucose, Bld: 86 mg/dL (ref 70–99)
POTASSIUM: 4.1 meq/L (ref 3.5–5.1)
SODIUM: 138 meq/L (ref 135–145)
TOTAL PROTEIN: 7 g/dL (ref 6.0–8.3)

## 2014-09-15 NOTE — Addendum Note (Signed)
Addended by: Verdie ShireBAYNES, ANGELA M on: 09/15/2014 02:21 PM   Modules accepted: Orders

## 2014-09-15 NOTE — Addendum Note (Signed)
Addended by: BAYNES, ANGELA M on: 09/15/2014 02:18 PM   Modules accepted: Orders  

## 2014-09-15 NOTE — Addendum Note (Signed)
Addended by: Verdie ShireBAYNES, ANGELA M on: 09/15/2014 01:19 PM   Modules accepted: Orders

## 2014-09-15 NOTE — Addendum Note (Signed)
Addended by: Verdie ShireBAYNES, ANGELA M on: 09/15/2014 01:26 PM   Modules accepted: Orders

## 2014-09-15 NOTE — Addendum Note (Signed)
Addended by: Verdie ShireBAYNES, ANGELA M on: 09/15/2014 02:18 PM   Modules accepted: Orders

## 2014-09-15 NOTE — Addendum Note (Signed)
Addended by: Verdie ShireBAYNES, ANGELA M on: 09/15/2014 01:48 PM   Modules accepted: Orders

## 2014-09-30 ENCOUNTER — Encounter: Payer: Self-pay | Admitting: Cardiology

## 2014-09-30 ENCOUNTER — Ambulatory Visit (INDEPENDENT_AMBULATORY_CARE_PROVIDER_SITE_OTHER): Payer: BLUE CROSS/BLUE SHIELD | Admitting: Cardiology

## 2014-09-30 VITALS — BP 130/80 | HR 76 | Ht 66.0 in | Wt 151.8 lb

## 2014-09-30 DIAGNOSIS — R0602 Shortness of breath: Secondary | ICD-10-CM

## 2014-09-30 DIAGNOSIS — R002 Palpitations: Secondary | ICD-10-CM | POA: Insufficient documentation

## 2014-09-30 DIAGNOSIS — R079 Chest pain, unspecified: Secondary | ICD-10-CM

## 2014-09-30 DIAGNOSIS — I4510 Unspecified right bundle-branch block: Secondary | ICD-10-CM | POA: Diagnosis not present

## 2014-09-30 NOTE — Progress Notes (Signed)
Cardiology Office Note   Date:  09/30/2014   ID:  Molly Snyder, DOB April 29, 1961, MRN [id number]  PCP:  Piedad ClimesMartin, William Cody, PA-C    Chief Complaint  Patient presents with  . Chest Pain  . Palpitations      History of Present Illness: Molly Snyder is a 54 y.o. female who presents for evaluation of chest pain and palpitations.  She has apparently been under a lot of stress at work.  She says that the chest pain has been occurring for a few months and has become more frequent.  It is nonexertional.  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  She describes it as a burning sensation like a hot iron in the midsternal region with tightness across her chest and into her back.. She also has a stabbing pain in her chest.  She notices more at night.  There is nothing that makes it better.  She has a history or RBBB in the past and was given NTG to take starting at age [age]. She had a heart cath done in her 30's  Due to similar symptoms that she is having now that was normal.   Her dad died of a massive MI at 4642.  She has SOB was well that is intermittent and can occur at rest or with exertion.  She also complains of palpitations that can occur at any time and occur a few times weekly.  She denies any dizziness or syncope with the palpitations. EKG at PCP office showed NSR with IRBBB.      Past Medical History  Diagnosis Date  . Right bundle branch block   . Breast lesion 09/02/2014    Left axillae   . Chest pain 09/02/2014  . Snoring 09/02/2014  . Tobacco use disorder 09/02/2014    Past Surgical History  Procedure Laterality Date  . Pylonidal cystectomy       Current Outpatient Prescriptions  Medication Sig Dispense Refill  . loratadine (CLARITIN) 10 MG tablet Take 10 mg by mouth daily.    . naproxen sodium (ANAPROX) 220 MG tablet Take 220 mg by mouth 2 (two) times daily with a meal.    . nitroGLYCERIN (NITROSTAT) 0.4 MG SL tablet Place 1 tablet  (0.4 mg total) under the tongue every 5 (five) minutes as needed for chest pain. 30 tablet 0  . simvastatin (ZOCOR) 10 MG tablet Take 1 tablet (10 mg total) by mouth daily. 30 tablet 3   No current facility-administered medications for this visit.    Allergies:   Penicillins    Social History:  The patient  reports that she has been smoking Cigarettes.  She has never used smokeless tobacco. She reports that she drinks alcohol. She reports that she does not use illicit drugs.   Family History:  The patient's family history includes Heart disease in her father.    ROS:  Please see the history of present illness.   Otherwise, review of systems are positive for none.   All other systems are reviewed and negative.    PHYSICAL EXAM: VS:  BP 130/80 mmHg  Pulse 76  Ht 5\' 6"  (1.676 m)  Wt 151 lb 12.8 oz (68.856 kg)  BMI 24.51 kg/m2  SpO2 99% , BMI Body mass index is 24.51 kg/(m^2). GEN: Well nourished, well developed, in no acute distress HEENT: normal Neck: no JVD, carotid bruits, or masses Cardiac: RRR; no murmurs, rubs, or gallops,no edema  Respiratory:  clear to auscultation bilaterally, normal work of breathing GI: soft, nontender, nondistended, + BS MS: no deformity or atrophy Skin: warm and dry, no rash Neuro:  Strength and sensation are intact Psych: euthymic mood, full affect   EKG:  EKG is not ordered today.    Recent Labs: 09/02/2014: Hemoglobin 13.7; Platelets 368.0; TSH 0.80 09/15/2014: ALT 15; BUN 15; Creatinine 0.82; Potassium 4.1; Sodium 138    Lipid Panel    Component Value Date/Time   CHOL 288* 09/02/2014 1022   TRIG 112.0 09/02/2014 1022   HDL 56.80 09/02/2014 1022   CHOLHDL 5 09/02/2014 1022   VLDL 22.4 09/02/2014 1022   LDLCALC 209* 09/02/2014 1022      Wt Readings from Last 3 Encounters:  09/30/14 151 lb 12.8 oz (68.856 kg)  09/01/14 151 lb 4 oz (68.607 kg)  02/27/13 151 lb 4 oz (68.607 kg)        ASSESSMENT AND PLAN:  1.  Chest pain of ?  Etiology.  She has similar CP in her 30's and cath was reportedly normal.  Her symptoms are very atypical for a cardiac etiology and may be GI in nature. The pain is burning and more noticeable at night.   2.  Palpitations - these do not occur everyday so will need to evaluate with a 30 day heart monitor 3.  RBBB - this apparently has been present since she was 17.  I will get a 2D echo to assess for structural heart disease   Current medicines are reviewed at length with the patient today.  The patient does not have concerns regarding medicines.  The following changes have been made:  no change  Labs/ tests ordered today include: see above assessment and plan No orders of the defined types were placed in this encounter.     Disposition:   FU with me PRN pending results of studies   SignedQuintella Reichert, MD  09/30/2014 11:02 AM    [facility] [address] [place], [place], [place]  [address] Phone: [phone]; Fax: [phone]

## 2014-09-30 NOTE — Patient Instructions (Signed)
Your physician has requested that you have an echocardiogram. Echocardiography is a painless test that uses sound waves to create images of your heart. It provides your doctor with information about the size and shape of your heart and how well your heart's chambers and valves are working. This procedure takes approximately one hour. There are no restrictions for this procedure.  Dr. Turner recommends you have a NUCLEAR STRESS TEST.  Your physician recommends that you schedule a follow-up appointment AS NEEDED with Dr. Turner pending your test results.  

## 2014-10-13 ENCOUNTER — Ambulatory Visit (HOSPITAL_COMMUNITY): Payer: BLUE CROSS/BLUE SHIELD | Attending: Cardiology | Admitting: Radiology

## 2014-10-13 ENCOUNTER — Ambulatory Visit (HOSPITAL_BASED_OUTPATIENT_CLINIC_OR_DEPARTMENT_OTHER): Payer: BLUE CROSS/BLUE SHIELD | Admitting: Radiology

## 2014-10-13 DIAGNOSIS — R0602 Shortness of breath: Secondary | ICD-10-CM | POA: Diagnosis not present

## 2014-10-13 DIAGNOSIS — R002 Palpitations: Secondary | ICD-10-CM | POA: Insufficient documentation

## 2014-10-13 DIAGNOSIS — I4510 Unspecified right bundle-branch block: Secondary | ICD-10-CM

## 2014-10-13 DIAGNOSIS — R079 Chest pain, unspecified: Secondary | ICD-10-CM

## 2014-10-13 MED ORDER — TECHNETIUM TC 99M SESTAMIBI GENERIC - CARDIOLITE
33.0000 | Freq: Once | INTRAVENOUS | Status: AC | PRN
Start: 2014-10-13 — End: 2014-10-13
  Administered 2014-10-13: 33 via INTRAVENOUS

## 2014-10-13 MED ORDER — TECHNETIUM TC 99M SESTAMIBI GENERIC - CARDIOLITE
11.0000 | Freq: Once | INTRAVENOUS | Status: AC | PRN
  Administered 2014-10-13: 11 via INTRAVENOUS

## 2014-10-13 NOTE — Progress Notes (Signed)
Echocardiogram performed.  

## 2014-10-13 NOTE — Progress Notes (Signed)
[facility] [address] NUCLEAR MED [address] [place], [place] [address] [phone]    Cardiology Nuclear Med Study  Simon RheinCynthia A Snyder is a 54 y.o. female     MRN : [id number]     DOB: [birth year]  Procedure Date: 10/13/2014  Nuclear Med Background Indication for Stress Test:  Evaluation for Ischemia History:  MPI ~15 yrs ago Cardiac Risk Factors: Smoker and IRBBB  Symptoms:  Chest Pain, DOE and Palpitations   Nuclear Pre-Procedure Caffeine/Decaff Intake:  None> 12 hrs NPO After: 10:45pm   Lungs:  clear O2 Sat: 97% on room air. IV 0.9% NS with Angio Cath:  22g  IV Site: L Forearm x 1, tolerated well IV Started by:  Irean HongPatsy Edwards, RN  Chest Size (in):  36 Cup Size: B  Height: 5\' 6"  (1.676 m)  Weight:  149 lb (67.586 kg)  BMI:  Body mass index is 24.06 kg/(m^2). Tech Comments:  N/A    Nuclear Med Study 1 or 2 day study: 1 day  Stress Test Type:  Stress  Reading MD: N/A  Order Authorizing Provider:  Armanda Magicraci Turner, MD  Resting Radionuclide: Technetium 339m Sestamibi  Resting Radionuclide Dose: 11.0 mCi   Stress Radionuclide:  Technetium 134m Sestamibi  Stress Radionuclide Dose: 33.0 mCi           Stress Protocol Rest HR: 73 Stress HR: 150  Rest BP: 123/77 Stress BP: 165/96  Exercise Time (min): 9:40 METS: 10.4   Predicted Max HR: 167 bpm % Max HR: 89.82 bpm Rate Pressure Product: [id number]   Dose of Adenosine (mg):  n/a Dose of Lexiscan: n/a mg  Dose of Atropine (mg): n/a Dose of Dobutamine: n/a mcg/kg/min (at max HR)  Stress Test Technologist: Nelson ChimesSharon Brooks, BS-ES  Nuclear Technologist:  Kerby NoraElzbieta Kubak, CNMT     Rest Procedure:  Myocardial perfusion imaging was performed at rest 45 minutes following the intravenous administration of Technetium 749m Sestamibi. Rest ECG: NSR, RVCD  Stress Procedure:  The patient exercised on the treadmill utilizing the Bruce Protocol for 9:40 minutes. The patient stopped due to fatigue and denied any chest pain.  Technetium 739m  Sestamibi was injected at peak exercise and myocardial perfusion imaging was performed after a brief delay. Stress ECG: No significant ST segment change suggestive of ischemia.  QPS Raw Data Images:  Acquisition technically good; normal left ventricular size. Stress Images:  Normal homogeneous uptake in all areas of the myocardium. Rest Images:  Normal homogeneous uptake in all areas of the myocardium. Subtraction (SDS):  No evidence of ischemia. Transient Ischemic Dilatation (Normal <1.22):  1.00 Lung/Heart Ratio (Normal <0.45):  0.27  Quantitative Gated Spect Images QGS EDV:  64 ml QGS ESV:  21 ml  Impression Exercise Capacity:  Good exercise capacity. BP Response:  Normal blood pressure response. Clinical Symptoms:  No chest pain or dyspnea ECG Impression:  No significant ST segment change suggestive of ischemia. Comparison with Prior Nuclear Study: No previous nuclear study performed  Overall Impression:  Normal stress nuclear study.  LV Ejection Fraction: 68%.  LV Wall Motion:  NL LV Function; NL Wall Motion   Olga MillersBrian Crenshaw

## 2014-10-14 ENCOUNTER — Telehealth: Payer: Self-pay | Admitting: *Deleted

## 2014-10-14 DIAGNOSIS — R079 Chest pain, unspecified: Secondary | ICD-10-CM

## 2014-10-14 NOTE — Telephone Encounter (Signed)
Please order a Coronary CTA with morphology and contrast to assess calcium score and rule out CAD

## 2014-10-14 NOTE — Telephone Encounter (Signed)
Patient called in for results from ECHO and Stress test. Explained results in detail and notations from Dr. Mayford Knifeurner. Patient verbalized understanding and appreciation of information. Patient states she is still having CP - same frequency and same intensity. Patient is agreeable to 30 Day Event monitor. Will route to Dr. Mayford Knifeurner as Lorain ChildesFYI.

## 2014-10-15 NOTE — Telephone Encounter (Signed)
Left message to call back  

## 2014-10-16 NOTE — Telephone Encounter (Signed)
F/u ° ° °Pt returning your call °

## 2014-10-16 NOTE — Telephone Encounter (Signed)
Coronary CTA with morph ordered for scheduling. Patient agrees with treatment plan.

## 2014-10-20 ENCOUNTER — Encounter: Payer: Self-pay | Admitting: Family Medicine

## 2014-10-20 ENCOUNTER — Ambulatory Visit (INDEPENDENT_AMBULATORY_CARE_PROVIDER_SITE_OTHER): Payer: BLUE CROSS/BLUE SHIELD | Admitting: Family Medicine

## 2014-10-20 VITALS — BP 120/82 | HR 76 | Temp 98.3°F | Ht 66.0 in | Wt 153.2 lb

## 2014-10-20 DIAGNOSIS — F32A Depression, unspecified: Secondary | ICD-10-CM

## 2014-10-20 DIAGNOSIS — F17200 Nicotine dependence, unspecified, uncomplicated: Secondary | ICD-10-CM

## 2014-10-20 DIAGNOSIS — Z720 Tobacco use: Secondary | ICD-10-CM | POA: Diagnosis not present

## 2014-10-20 DIAGNOSIS — E782 Mixed hyperlipidemia: Secondary | ICD-10-CM | POA: Diagnosis not present

## 2014-10-20 DIAGNOSIS — F329 Major depressive disorder, single episode, unspecified: Secondary | ICD-10-CM

## 2014-10-20 DIAGNOSIS — T7840XD Allergy, unspecified, subsequent encounter: Secondary | ICD-10-CM

## 2014-10-20 DIAGNOSIS — F419 Anxiety disorder, unspecified: Secondary | ICD-10-CM

## 2014-10-20 DIAGNOSIS — R002 Palpitations: Secondary | ICD-10-CM | POA: Diagnosis not present

## 2014-10-20 DIAGNOSIS — F418 Other specified anxiety disorders: Secondary | ICD-10-CM | POA: Diagnosis not present

## 2014-10-20 MED ORDER — SIMVASTATIN 10 MG PO TABS: 10.0000 mg | ORAL_TABLET | Freq: Every day | ORAL | Status: DC

## 2014-10-20 NOTE — Patient Instructions (Signed)

## 2014-10-20 NOTE — Progress Notes (Signed)
Pre visit review using our clinic review tool, if applicable. No additional management support is needed unless otherwise documented below in the visit note. 

## 2014-10-25 ENCOUNTER — Encounter: Payer: Self-pay | Admitting: Family Medicine

## 2014-10-25 DIAGNOSIS — T7840XA Allergy, unspecified, initial encounter: Secondary | ICD-10-CM

## 2014-10-25 DIAGNOSIS — E785 Hyperlipidemia, unspecified: Secondary | ICD-10-CM | POA: Insufficient documentation

## 2014-10-25 DIAGNOSIS — E782 Mixed hyperlipidemia: Secondary | ICD-10-CM

## 2014-10-25 HISTORY — DX: Mixed hyperlipidemia: E78.2

## 2014-10-25 HISTORY — DX: Allergy, unspecified, initial encounter: T78.40XA

## 2014-10-25 NOTE — Assessment & Plan Note (Signed)
Tolerating statin, encouraged heart healthy diet, avoid trans fats, minimize simple carbs and saturated fats. Increase exercise as tolerated 

## 2014-10-25 NOTE — Progress Notes (Signed)
Molly RheinCynthia A Rishel  [id number] July 22, 1960 10/25/2014      Progress Note-Follow Up  Subjective  Chief Complaint  Chief Complaint  Patient presents with  . Follow-up    HPI  Patient is a 54 y.o. female in today for routine medical care. Patient is in today for follow-up. Continues to struggle with fatigue and malaise but does acknowledge that her palpitations as well as stressors better. She's had some recent loose stool but no bloody or tarry stool. Continues to struggle with difficulty at work but it is improving as well. Continues to struggle diffuse arthralgias as well hot flashes. Denies CP/palp/SOB/HA/congestion/fevers/GI or GU c/o. Taking meds as prescribed  Past Medical History  Diagnosis Date  . Right bundle branch block   . Breast lesion 09/02/2014    Left axillae   . Chest pain 09/02/2014  . Snoring 09/02/2014  . Tobacco use disorder 09/02/2014  . Hyperlipidemia, mixed 10/25/2014  . Allergic state 10/25/2014    Past Surgical History  Procedure Laterality Date  . Pylonidal cystectomy      Family History  Problem Relation Age of Onset  . Heart disease Father     History   Social History  . Marital Status: Single    Spouse Name: N/A  . Number of Children: N/A  . Years of Education: N/A   Occupational History  . Not on file.   Social History Main Topics  . Smoking status: Current Some Day Smoker    Types: Cigarettes  . Smokeless tobacco: Never Used  . Alcohol Use: Yes     Comment: 1 beer once a month  . Drug Use: No  . Sexual Activity: Yes    Birth Control/ Protection: None   Other Topics Concern  . Not on file   Social History Narrative    Current Outpatient Prescriptions on File Prior to Visit  Medication Sig Dispense Refill  . loratadine (CLARITIN) 10 MG tablet Take 10 mg by mouth daily.    . naproxen sodium (ANAPROX) 220 MG tablet Take 220 mg by mouth 2 (two) times daily with a meal.    . nitroGLYCERIN (NITROSTAT) 0.4 MG SL tablet Place 1 tablet  (0.4 mg total) under the tongue every 5 (five) minutes as needed for chest pain. 30 tablet 0   No current facility-administered medications on file prior to visit.    Allergies  Allergen Reactions  . Penicillins Anaphylaxis    Review of Systems  Review of Systems  Constitutional: Positive for malaise/fatigue. Negative for fever.  HENT: Positive for congestion.   Eyes: Negative for discharge.  Respiratory: Negative for shortness of breath.   Cardiovascular: Negative for chest pain, palpitations and leg swelling.  Gastrointestinal: Positive for diarrhea. Negative for nausea and abdominal pain.  Genitourinary: Negative for dysuria.  Musculoskeletal: Negative for falls.  Skin: Negative for rash.  Neurological: Negative for loss of consciousness and headaches.  Endo/Heme/Allergies: Negative for polydipsia.  Psychiatric/Behavioral: Positive for depression. Negative for suicidal ideas. The patient is nervous/anxious. The patient does not have insomnia.     Objective  BP 120/82 mmHg  Pulse 76  Temp(Src) 98.3 F (36.8 C) (Oral)  Ht 5\' 6"  (1.676 m)  Wt 153 lb 4 oz (69.514 kg)  BMI 24.75 kg/m2  SpO2 99%  Physical Exam  Physical Exam  Constitutional: She is oriented to person, place, and time and well-developed, well-nourished, and in no distress. No distress.  HENT:  Head: Normocephalic and atraumatic.  Eyes: Conjunctivae are normal.  Neck:  Neck supple. No thyromegaly present.  Cardiovascular: Normal rate, regular rhythm and normal heart sounds.   No murmur heard. Pulmonary/Chest: Effort normal and breath sounds normal. She has no wheezes.  Abdominal: She exhibits no distension and no mass.  Musculoskeletal: She exhibits no edema.  Lymphadenopathy:    She has no cervical adenopathy.  Neurological: She is alert and oriented to person, place, and time.  Skin: Skin is warm and dry. No rash noted. She is not diaphoretic.  Psychiatric: Memory, affect and judgment normal.     Lab Results  Component Value Date   TSH 0.80 09/02/2014   Lab Results  Component Value Date   WBC 8.7 09/02/2014   HGB 13.7 09/02/2014   HCT 40.5 09/02/2014   MCV 88.4 09/02/2014   PLT 368.0 09/02/2014   Lab Results  Component Value Date   CREATININE 0.82 09/15/2014   BUN 15 09/15/2014   NA 138 09/15/2014   K 4.1 09/15/2014   CL 107 09/15/2014   CO2 29 09/15/2014   Lab Results  Component Value Date   ALT 15 09/15/2014   AST 14 09/15/2014   ALKPHOS 64 09/15/2014   BILITOT 0.4 09/15/2014   Lab Results  Component Value Date   CHOL 288* 09/02/2014   Lab Results  Component Value Date   HDL 56.80 09/02/2014   Lab Results  Component Value Date   LDLCALC 209* 09/02/2014   Lab Results  Component Value Date   TRIG 112.0 09/02/2014   Lab Results  Component Value Date   CHOLHDL 5 09/02/2014     Assessment & Plan  Anxiety and depression Is managing without meds at this time. Encouraged to consider meds and/or counseling if symptoms.    Tobacco use disorder Encouraged complete cessation. Discussed need to quit as relates to risk of numerous cancers, cardiac and pulmonary disease as well as neurologic complications. Counseled for greater than 3 minutes   Hyperlipidemia, mixed Tolerating statin, encouraged heart healthy diet, avoid trans fats, minimize simple carbs and saturated fats. Increase exercise as tolerated   Allergic state Encouraged Loratadine daily and add Flonase prn if worsens

## 2014-10-25 NOTE — Assessment & Plan Note (Signed)
Is managing without meds at this time. Encouraged to consider meds and/or counseling if symptoms.

## 2014-10-25 NOTE — Assessment & Plan Note (Signed)
Encouraged Loratadine daily and add Flonase prn if worsens

## 2014-10-25 NOTE — Assessment & Plan Note (Signed)
Encouraged complete cessation. Discussed need to quit as relates to risk of numerous cancers, cardiac and pulmonary disease as well as neurologic complications. Counseled for greater than 3 minutes 

## 2014-11-04 ENCOUNTER — Ambulatory Visit (HOSPITAL_COMMUNITY): Payer: BLUE CROSS/BLUE SHIELD

## 2014-11-09 ENCOUNTER — Encounter (HOSPITAL_BASED_OUTPATIENT_CLINIC_OR_DEPARTMENT_OTHER): Payer: BLUE CROSS/BLUE SHIELD

## 2014-12-07 ENCOUNTER — Other Ambulatory Visit (INDEPENDENT_AMBULATORY_CARE_PROVIDER_SITE_OTHER): Payer: BLUE CROSS/BLUE SHIELD

## 2014-12-07 DIAGNOSIS — E782 Mixed hyperlipidemia: Secondary | ICD-10-CM

## 2014-12-07 DIAGNOSIS — R519 Headache, unspecified: Secondary | ICD-10-CM

## 2014-12-07 DIAGNOSIS — E785 Hyperlipidemia, unspecified: Secondary | ICD-10-CM | POA: Diagnosis not present

## 2014-12-07 DIAGNOSIS — R5382 Chronic fatigue, unspecified: Secondary | ICD-10-CM | POA: Diagnosis not present

## 2014-12-07 DIAGNOSIS — R079 Chest pain, unspecified: Secondary | ICD-10-CM | POA: Diagnosis not present

## 2014-12-07 DIAGNOSIS — G4730 Sleep apnea, unspecified: Secondary | ICD-10-CM

## 2014-12-07 DIAGNOSIS — R002 Palpitations: Secondary | ICD-10-CM

## 2014-12-07 DIAGNOSIS — R51 Headache: Secondary | ICD-10-CM

## 2014-12-08 LAB — CBC WITH DIFFERENTIAL/PLATELET
BASOS PCT: 0.5 % (ref 0.0–3.0)
Basophils Absolute: 0 10*3/uL (ref 0.0–0.1)
Eosinophils Absolute: 0.3 10*3/uL (ref 0.0–0.7)
Eosinophils Relative: 5.9 % — ABNORMAL HIGH (ref 0.0–5.0)
HCT: 41.7 % (ref 36.0–46.0)
Hemoglobin: 14 g/dL (ref 12.0–15.0)
Lymphocytes Relative: 22.2 % (ref 12.0–46.0)
Lymphs Abs: 1.3 10*3/uL (ref 0.7–4.0)
MCHC: 33.5 g/dL (ref 30.0–36.0)
MCV: 88.9 fl (ref 78.0–100.0)
MONO ABS: 0.2 10*3/uL (ref 0.1–1.0)
Monocytes Relative: 3.3 % (ref 3.0–12.0)
NEUTROS ABS: 4 10*3/uL (ref 1.4–7.7)
NEUTROS PCT: 68.1 % (ref 43.0–77.0)
Platelets: 214 10*3/uL (ref 150.0–400.0)
RBC: 4.69 Mil/uL (ref 3.87–5.11)
RDW: 13.9 % (ref 11.5–15.5)
WBC: 5.8 10*3/uL (ref 4.0–10.5)

## 2014-12-08 LAB — COMPREHENSIVE METABOLIC PANEL
ALBUMIN: 4 g/dL (ref 3.5–5.2)
ALT: 12 U/L (ref 0–35)
AST: 23 U/L (ref 0–37)
Alkaline Phosphatase: 66 U/L (ref 39–117)
BUN: 20 mg/dL (ref 6–23)
CO2: 18 mEq/L — ABNORMAL LOW (ref 19–32)
Calcium: 9.2 mg/dL (ref 8.4–10.5)
Chloride: 105 mEq/L (ref 96–112)
Creatinine, Ser: 0.84 mg/dL (ref 0.40–1.20)
GFR: 75.18 mL/min (ref >60.00)
Glucose, Bld: 102 mg/dL — ABNORMAL HIGH (ref 70–99)
POTASSIUM: 4.6 meq/L (ref 3.5–5.1)
SODIUM: 137 meq/L (ref 135–145)
TOTAL PROTEIN: 6.8 g/dL (ref 6.0–8.3)
Total Bilirubin: 0.2 mg/dL (ref 0.2–1.2)

## 2014-12-08 LAB — LIPID PANEL
CHOLESTEROL: 226 mg/dL — AB (ref 0–200)
HDL: 52.1 mg/dL (ref >39.00)
LDL Cholesterol: 149 mg/dL — ABNORMAL HIGH (ref 0–99)
NonHDL: 173.9
Total CHOL/HDL Ratio: 4
Triglycerides: 125 mg/dL (ref 0.0–149.0)
VLDL: 25 mg/dL (ref 0.0–40.0)

## 2014-12-08 LAB — TSH: TSH: 1.2 u[IU]/mL (ref 0.35–4.50)

## 2014-12-09 ENCOUNTER — Telehealth: Payer: Self-pay

## 2014-12-09 MED ORDER — SIMVASTATIN 20 MG PO TABS: 20.0000 mg | ORAL_TABLET | Freq: Every day | ORAL | Status: DC

## 2014-12-09 NOTE — Telephone Encounter (Signed)
Pt notified of results, no questions at this time. Medications sent to pharmacy.

## 2014-12-09 NOTE — Telephone Encounter (Signed)
-----   Message from Bradd CanaryStacey A Blyth, MD sent at 12/08/2014 10:41 PM EDT ----- Notify labs look good cholesterol is better but could be a little better I would recommend she increase her simvastatin to 20 mg tabs 1 tab po qhs, disp #30 with 3 refill then repeat labs and have an appt in 3-4 months.

## 2016-07-12 DIAGNOSIS — S8262XD Displaced fracture of lateral malleolus of left fibula, subsequent encounter for closed fracture with routine healing: Secondary | ICD-10-CM | POA: Diagnosis not present

## 2016-07-12 DIAGNOSIS — S82832A Other fracture of upper and lower end of left fibula, initial encounter for closed fracture: Secondary | ICD-10-CM | POA: Diagnosis not present

## 2017-01-05 ENCOUNTER — Telehealth: Payer: Self-pay | Admitting: Family Medicine

## 2017-01-05 NOTE — Telephone Encounter (Signed)
lvm advising patient of message below °

## 2017-01-05 NOTE — Telephone Encounter (Signed)
Relation to pt: self  Call back [phone]   Reason for call:  Patient last seen 10/20/14 due to in active insurance, patient currently has insurance and would like to re establish please advise

## 2017-01-05 NOTE — Telephone Encounter (Signed)
That is fine set her back up

## 2017-03-29 ENCOUNTER — Ambulatory Visit: Payer: BLUE CROSS/BLUE SHIELD | Admitting: Family Medicine

## 2017-04-03 ENCOUNTER — Ambulatory Visit (INDEPENDENT_AMBULATORY_CARE_PROVIDER_SITE_OTHER): Payer: 59 | Admitting: Family Medicine

## 2017-04-03 VITALS — BP 110/70 | HR 83 | Temp 97.7°F | Resp 18 | Wt 145.6 lb

## 2017-04-03 DIAGNOSIS — F17200 Nicotine dependence, unspecified, uncomplicated: Secondary | ICD-10-CM | POA: Diagnosis not present

## 2017-04-03 DIAGNOSIS — Z1239 Encounter for other screening for malignant neoplasm of breast: Secondary | ICD-10-CM

## 2017-04-03 DIAGNOSIS — E785 Hyperlipidemia, unspecified: Secondary | ICD-10-CM | POA: Diagnosis not present

## 2017-04-03 DIAGNOSIS — R202 Paresthesia of skin: Secondary | ICD-10-CM

## 2017-04-03 DIAGNOSIS — M542 Cervicalgia: Secondary | ICD-10-CM

## 2017-04-03 DIAGNOSIS — M5412 Radiculopathy, cervical region: Secondary | ICD-10-CM | POA: Diagnosis not present

## 2017-04-03 DIAGNOSIS — M791 Myalgia, unspecified site: Secondary | ICD-10-CM

## 2017-04-03 DIAGNOSIS — M25512 Pain in left shoulder: Secondary | ICD-10-CM | POA: Diagnosis not present

## 2017-04-03 DIAGNOSIS — G8929 Other chronic pain: Secondary | ICD-10-CM

## 2017-04-03 DIAGNOSIS — Z1231 Encounter for screening mammogram for malignant neoplasm of breast: Secondary | ICD-10-CM | POA: Diagnosis not present

## 2017-04-03 DIAGNOSIS — R739 Hyperglycemia, unspecified: Secondary | ICD-10-CM

## 2017-04-03 NOTE — Progress Notes (Signed)
Subjective:  I acted as a Neurosurgeon for Dr. Abner Greenspan. [place], [place]  Patient ID: Molly Snyder, female    DOB: [birth year], 56 y.o.   MRN: [id number]  No chief complaint on file.   HPI  Patient is in today for to re establish care and follow up. Her most acute concern is the fact that her neck and left shoulder pain have been present for over a year and are worsening. He notes weakness and paresthesias occurring down left arm. She has a know histoy of disc herniations in her lumbar spine and she still hass ome ongoing low back pain. She broke her left ankle in November of 2017 and that still causes her some pain as well. No recent febrile illness or hospitalizations. Denies CP/palp/SOB/HA/congestion/fevers/GI or GU c/o. Taking meds as prescribed    Patient Care Team: [name], MD as PCP - General (Family Medicine)   Past Medical History:  Diagnosis Date  . Allergic state 10/25/2014  . Breast lesion 09/02/2014   Left axillae   . Chest pain 09/02/2014  . Hyperlipidemia, mixed 10/25/2014  . Right bundle branch block   . Snoring 09/02/2014  . Tobacco use disorder 09/02/2014    Past Surgical History:  Procedure Laterality Date  . pylonidal cystectomy      Family History  Problem Relation Age of Onset  . Heart disease Father     Social History   Social History  . Marital status: Single    Spouse name: N/A  . Number of children: N/A  . Years of education: N/A   Occupational History  . Not on file.   Social History Main Topics  . Smoking status: Current Some Day Smoker    Types: Cigarettes  . Smokeless tobacco: Never Used  . Alcohol use Yes     Comment: 1 beer once a month  . Drug use: No  . Sexual activity: Yes    Birth control/ protection: None   Other Topics Concern  . Not on file   Social History Narrative  . No narrative on file    Outpatient Medications Prior to Visit  Medication Sig Dispense Refill  . loratadine (CLARITIN) 10 MG tablet Take 10 mg by  mouth daily.    . naproxen sodium (ANAPROX) 220 MG tablet Take 220 mg by mouth 2 (two) times daily with a meal.    . nitroGLYCERIN (NITROSTAT) 0.4 MG SL tablet Place 1 tablet (0.4 mg total) under the tongue every 5 (five) minutes as needed for chest pain. 30 tablet 0  . simvastatin (ZOCOR) 20 MG tablet Take 1 tablet (20 mg total) by mouth at bedtime. 30 tablet 3   No facility-administered medications prior to visit.     Allergies  Allergen Reactions  . Penicillins Anaphylaxis  . Amoxicillin Rash    Review of Systems  Constitutional: Negative for fever and malaise/fatigue.  HENT: Negative for congestion.   Eyes: Negative for blurred vision.  Respiratory: Negative for cough and shortness of breath.   Cardiovascular: Negative for chest pain, palpitations and leg swelling.  Gastrointestinal: Negative for vomiting.  Musculoskeletal: Positive for back pain, joint pain, myalgias and neck pain.  Skin: Negative for rash.  Neurological: Positive for tingling and focal weakness. Negative for loss of consciousness and headaches.       Objective:    Physical Exam  Constitutional: She is oriented to person, place, and time. She appears well-developed and well-nourished. No distress.  HENT:  Head: Normocephalic and atraumatic.  Eyes: Conjunctivae are normal.  Neck: Normal range of motion. No thyromegaly present.  Cardiovascular: Normal rate and regular rhythm.   Pulmonary/Chest: Effort normal and breath sounds normal. She has no wheezes.  Abdominal: Soft. Bowel sounds are normal. There is no tenderness.  Musculoskeletal: Normal range of motion. She exhibits no edema or deformity.  Neurological: She is alert and oriented to person, place, and time.  Skin: Skin is warm and dry. She is not diaphoretic.  Psychiatric: She has a normal mood and affect.    BP 110/70 (BP Location: Left Arm, Patient Position: Sitting, Cuff Size: Normal)   Pulse 83   Temp 97.7 F (36.5 C) (Oral)   Resp 18    Wt 145 lb 9.6 oz (66 kg)   SpO2 97%   BMI 23.50 kg/m  Wt Readings from Last 3 Encounters:  04/03/17 145 lb 9.6 oz (66 kg)  10/20/14 153 lb 4 oz (69.5 kg)  10/13/14 149 lb (67.6 kg)   BP Readings from Last 3 Encounters:  04/03/17 110/70  10/20/14 120/82  10/13/14 123/77      There is no immunization history on file for this patient.  Health Maintenance  Topic Date Due  . Hepatitis C Screening  Jul 24, 1960  . HIV Screening  03/29/1976  . TETANUS/TDAP  03/29/1980  . PAP SMEAR  03/29/1982  . COLONOSCOPY  03/30/2011  . MAMMOGRAM  09/10/2016  . INFLUENZA VACCINE  01/31/2017    Lab Results  Component Value Date   WBC 8.9 04/04/2017   HGB 14.1 04/04/2017   HCT 43.5 04/04/2017   PLT 350.0 04/04/2017   GLUCOSE 103 (H) 04/04/2017   CHOL 327 (H) 04/04/2017   TRIG 171.0 (H) 04/04/2017   HDL 50.70 04/04/2017   LDLCALC 242 (H) 04/04/2017   ALT 10 04/04/2017   AST 11 04/04/2017   NA 139 04/04/2017   K 4.3 04/04/2017   CL 107 04/04/2017   CREATININE 0.78 04/04/2017   BUN 17 04/04/2017   CO2 27 04/04/2017   TSH 0.60 04/04/2017   HGBA1C 6.0 04/04/2017    Lab Results  Component Value Date   TSH 0.60 04/04/2017   Lab Results  Component Value Date   WBC 8.9 04/04/2017   HGB 14.1 04/04/2017   HCT 43.5 04/04/2017   MCV 92.1 04/04/2017   PLT 350.0 04/04/2017   Lab Results  Component Value Date   NA 139 04/04/2017   K 4.3 04/04/2017   CO2 27 04/04/2017   GLUCOSE 103 (H) 04/04/2017   BUN 17 04/04/2017   CREATININE 0.78 04/04/2017   BILITOT 0.5 04/04/2017   ALKPHOS 57 04/04/2017   AST 11 04/04/2017   ALT 10 04/04/2017   PROT 6.9 04/04/2017   ALBUMIN 4.1 04/04/2017   CALCIUM 9.4 04/04/2017   GFR 81.20 04/04/2017   Lab Results  Component Value Date   CHOL 327 (H) 04/04/2017   Lab Results  Component Value Date   HDL 50.70 04/04/2017   Lab Results  Component Value Date   LDLCALC 242 (H) 04/04/2017   Lab Results  Component Value Date   TRIG 171.0 (H)  04/04/2017   Lab Results  Component Value Date   CHOLHDL 6 04/04/2017   Lab Results  Component Value Date   HGBA1C 6.0 04/04/2017         Assessment & Plan:   Problem List Items Addressed This Visit    Breast cancer screening    MM ordered today      Relevant Orders  MM SCREENING BREAST TOMO BILATERAL   Left cervical radiculopathy    xrays ordered and referred to ortho due to the persistence of her symptoms and due to paresthesias and weakness now occurring into her left arm.       Tobacco use disorder    Encouraged complete cessation. Discussed need to quit as relates to risk of numerous cancers, cardiac and pulmonary disease as well as neurologic complications. Counseled for greater than 3 minutes      Hyperlipidemia    Encouraged heart healthy diet, increase exercise, avoid trans fats, consider a krill oil cap daily. Atorvastatin      Relevant Orders   Lipid panel (Completed)   CBC (Completed)   Hyperglycemia    minimize simple carbs. Increase exercise as tolerated.       Relevant Orders   Hemoglobin A1c (Completed)   TSH (Completed)   Chronic left shoulder pain   Relevant Orders   DG Shoulder Left (Completed)   Ambulatory referral to Orthopedic Surgery   CBC (Completed)   Neck pain - Primary    Xray ordered. Encouraged moist heat and gentle stretching as tolerated. May try NSAIDs and prescription meds as directed and report if symptoms worsen or seek immediate care and topical treatments prn      Relevant Orders   DG Cervical Spine Complete (Completed)   Ambulatory referral to Orthopedic Surgery   Paresthesias   Relevant Orders   Comprehensive metabolic panel (Completed)   Magnesium (Completed)   Vitamin B12 (Completed)    Other Visit Diagnoses    Myalgia       Relevant Orders   Comprehensive metabolic panel (Completed)   CBC (Completed)   Magnesium (Completed)   Vitamin B12 (Completed)      I have discontinued Ms. Copenhaver's naproxen  sodium, nitroGLYCERIN, and simvastatin. I am also having her maintain her loratadine.  No orders of the defined types were placed in this encounter.   CMA served as Neurosurgeon during this visit. History, Physical and Plan performed by medical provider. Documentation and orders reviewed and attested to.  Danise Edge, MD

## 2017-04-03 NOTE — Patient Instructions (Signed)
Benefiber   Shingrix is the new shingles shot, 2 shots over 6 months, check with insurance  Preventive Care 40-64 Years, Female Preventive care refers to lifestyle choices and visits with your health care provider that can promote health and wellness. What does preventive care include?  A yearly physical exam. This is also called an annual well check.  Dental exams once or twice a year.  Routine eye exams. Ask your health care provider how often you should have your eyes checked.  Personal lifestyle choices, including: ? Daily care of your teeth and gums. ? Regular physical activity. ? Eating a healthy diet. ? Avoiding tobacco and drug use. ? Limiting alcohol use. ? Practicing safe sex. ? Taking low-dose aspirin daily starting at age 50. ? Taking vitamin and mineral supplements as recommended by your health care provider. What happens during an annual well check? The services and screenings done by your health care provider during your annual well check will depend on your age, overall health, lifestyle risk factors, and family history of disease. Counseling Your health care provider may ask you questions about your:  Alcohol use.  Tobacco use.  Drug use.  Emotional well-being.  Home and relationship well-being.  Sexual activity.  Eating habits.  Work and work Statistician.  Method of birth control.  Menstrual cycle.  Pregnancy history.  Screening You may have the following tests or measurements:  Height, weight, and BMI.  Blood pressure.  Lipid and cholesterol levels. These may be checked every 5 years, or more frequently if you are over 97 years old.  Skin check.  Lung cancer screening. You may have this screening every year starting at age 81 if you have a 30-pack-year history of smoking and currently smoke or have quit within the past 15 years.  Fecal occult blood test (FOBT) of the stool. You may have this test every year starting at age  31.  Flexible sigmoidoscopy or colonoscopy. You may have a sigmoidoscopy every 5 years or a colonoscopy every 10 years starting at age 83.  Hepatitis C blood test.  Hepatitis B blood test.  Sexually transmitted disease (STD) testing.  Diabetes screening. This is done by checking your blood sugar (glucose) after you have not eaten for a while (fasting). You may have this done every 1-3 years.  Mammogram. This may be done every 1-2 years. Talk to your health care provider about when you should start having regular mammograms. This may depend on whether you have a family history of breast cancer.  BRCA-related cancer screening. This may be done if you have a family history of breast, ovarian, tubal, or peritoneal cancers.  Pelvic exam and Pap test. This may be done every 3 years starting at age 65. Starting at age 62, this may be done every 5 years if you have a Pap test in combination with an HPV test.  Bone density scan. This is done to screen for osteoporosis. You may have this scan if you are at high risk for osteoporosis.  Discuss your test results, treatment options, and if necessary, the need for more tests with your health care provider. Vaccines Your health care provider may recommend certain vaccines, such as:  Influenza vaccine. This is recommended every year.  Tetanus, diphtheria, and acellular pertussis (Tdap, Td) vaccine. You may need a Td booster every 10 years.  Varicella vaccine. You may need this if you have not been vaccinated.  Zoster vaccine. You may need this after age 59.  Measles, mumps,  and rubella (MMR) vaccine. You may need at least one dose of MMR if you were born in 1957 or later. You may also need a second dose.  Pneumococcal 13-valent conjugate (PCV13) vaccine. You may need this if you have certain conditions and were not previously vaccinated.  Pneumococcal polysaccharide (PPSV23) vaccine. You may need one or two doses if you smoke cigarettes or if you  have certain conditions.  Meningococcal vaccine. You may need this if you have certain conditions.  Hepatitis A vaccine. You may need this if you have certain conditions or if you travel or work in places where you may be exposed to hepatitis A.  Hepatitis B vaccine. You may need this if you have certain conditions or if you travel or work in places where you may be exposed to hepatitis B.  Haemophilus influenzae type b (Hib) vaccine. You may need this if you have certain conditions.  Talk to your health care provider about which screenings and vaccines you need and how often you need them. This information is not intended to replace advice given to you by your health care provider. Make sure you discuss any questions you have with your health care provider. Document Released: 07/16/2015 Document Revised: 03/08/2016 Document Reviewed: 04/20/2015 Elsevier Interactive Patient Education  2017 [employer].

## 2017-04-04 ENCOUNTER — Other Ambulatory Visit (INDEPENDENT_AMBULATORY_CARE_PROVIDER_SITE_OTHER): Payer: 59

## 2017-04-04 ENCOUNTER — Ambulatory Visit (HOSPITAL_BASED_OUTPATIENT_CLINIC_OR_DEPARTMENT_OTHER)
Admission: RE | Admit: 2017-04-04 | Discharge: 2017-04-04 | Disposition: A | Discharge status: Home / Self Care | Payer: 59 | Source: Ambulatory Visit | Attending: Family Medicine | Admitting: Family Medicine

## 2017-04-04 DIAGNOSIS — R202 Paresthesia of skin: Secondary | ICD-10-CM

## 2017-04-04 DIAGNOSIS — M25512 Pain in left shoulder: Secondary | ICD-10-CM

## 2017-04-04 DIAGNOSIS — E785 Hyperlipidemia, unspecified: Secondary | ICD-10-CM | POA: Diagnosis not present

## 2017-04-04 DIAGNOSIS — M542 Cervicalgia: Secondary | ICD-10-CM | POA: Diagnosis not present

## 2017-04-04 DIAGNOSIS — R739 Hyperglycemia, unspecified: Secondary | ICD-10-CM

## 2017-04-04 DIAGNOSIS — G8929 Other chronic pain: Secondary | ICD-10-CM | POA: Diagnosis not present

## 2017-04-04 DIAGNOSIS — M791 Myalgia, unspecified site: Secondary | ICD-10-CM

## 2017-04-04 LAB — COMPREHENSIVE METABOLIC PANEL
ALBUMIN: 4.1 g/dL (ref 3.5–5.2)
ALK PHOS: 57 U/L (ref 39–117)
ALT: 10 U/L (ref 0–35)
AST: 11 U/L (ref 0–37)
BILIRUBIN TOTAL: 0.5 mg/dL (ref 0.2–1.2)
BUN: 17 mg/dL (ref 6–23)
CALCIUM: 9.4 mg/dL (ref 8.4–10.5)
CO2: 27 meq/L (ref 19–32)
Chloride: 107 mEq/L (ref 96–112)
Creatinine, Ser: 0.78 mg/dL (ref 0.40–1.20)
GFR: 81.2 mL/min (ref >60.00)
Glucose, Bld: 103 mg/dL — ABNORMAL HIGH (ref 70–99)
Potassium: 4.3 mEq/L (ref 3.5–5.1)
Sodium: 139 mEq/L (ref 135–145)
TOTAL PROTEIN: 6.9 g/dL (ref 6.0–8.3)

## 2017-04-04 LAB — VITAMIN B12: VITAMIN B 12: 352 pg/mL (ref 211–911)

## 2017-04-04 LAB — LIPID PANEL
CHOLESTEROL: 327 mg/dL — AB (ref 0–200)
HDL: 50.7 mg/dL (ref >39.00)
LDL Cholesterol: 242 mg/dL — ABNORMAL HIGH (ref 0–99)
NonHDL: 276.23
TRIGLYCERIDES: 171 mg/dL — AB (ref 0.0–149.0)
Total CHOL/HDL Ratio: 6
VLDL: 34.2 mg/dL (ref 0.0–40.0)

## 2017-04-04 LAB — CBC
HEMATOCRIT: 43.5 % (ref 36.0–46.0)
HEMOGLOBIN: 14.1 g/dL (ref 12.0–15.0)
MCHC: 32.3 g/dL (ref 30.0–36.0)
MCV: 92.1 fl (ref 78.0–100.0)
PLATELETS: 350 10*3/uL (ref 150.0–400.0)
RBC: 4.73 Mil/uL (ref 3.87–5.11)
RDW: 13.8 % (ref 11.5–15.5)
WBC: 8.9 10*3/uL (ref 4.0–10.5)

## 2017-04-04 LAB — TSH: TSH: 0.6 u[IU]/mL (ref 0.35–4.50)

## 2017-04-04 LAB — HEMOGLOBIN A1C: Hgb A1c MFr Bld: 6 % (ref 4.6–6.5)

## 2017-04-04 LAB — MAGNESIUM: Magnesium: 2.1 mg/dL (ref 1.5–2.5)

## 2017-04-05 DIAGNOSIS — M5412 Radiculopathy, cervical region: Secondary | ICD-10-CM | POA: Diagnosis not present

## 2017-04-05 DIAGNOSIS — M25512 Pain in left shoulder: Secondary | ICD-10-CM | POA: Diagnosis not present

## 2017-04-05 DIAGNOSIS — G8929 Other chronic pain: Secondary | ICD-10-CM | POA: Diagnosis not present

## 2017-04-06 MED ORDER — ATORVASTATIN CALCIUM 10 MG PO TABS: 10.0000 mg | ORAL_TABLET | Freq: Every day | ORAL | 3 refills | Status: DC

## 2017-04-06 NOTE — Addendum Note (Signed)
Addended by: Crissie Sickles A on: 04/06/2017 11:38 AM   Modules accepted: Orders

## 2017-04-08 DIAGNOSIS — G8929 Other chronic pain: Secondary | ICD-10-CM | POA: Insufficient documentation

## 2017-04-08 DIAGNOSIS — R202 Paresthesia of skin: Secondary | ICD-10-CM | POA: Insufficient documentation

## 2017-04-08 DIAGNOSIS — R739 Hyperglycemia, unspecified: Secondary | ICD-10-CM | POA: Insufficient documentation

## 2017-04-08 DIAGNOSIS — M25512 Pain in left shoulder: Secondary | ICD-10-CM

## 2017-04-08 DIAGNOSIS — M542 Cervicalgia: Secondary | ICD-10-CM | POA: Insufficient documentation

## 2017-04-08 NOTE — Assessment & Plan Note (Signed)
MM ordered today

## 2017-04-08 NOTE — Assessment & Plan Note (Signed)
xrays ordered and referred to ortho due to the persistence of her symptoms and due to paresthesias and weakness now occurring into her left arm.

## 2017-04-08 NOTE — Assessment & Plan Note (Signed)
Encouraged complete cessation. Discussed need to quit as relates to risk of numerous cancers, cardiac and pulmonary disease as well as neurologic complications. Counseled for greater than 3 minutes 

## 2017-04-08 NOTE — Assessment & Plan Note (Signed)
Xray ordered. Encouraged moist heat and gentle stretching as tolerated. May try NSAIDs and prescription meds as directed and report if symptoms worsen or seek immediate care and topical treatments prn

## 2017-04-08 NOTE — Assessment & Plan Note (Signed)
Encouraged heart healthy diet, increase exercise, avoid trans fats, consider a krill oil cap daily. Atorvastatin 

## 2017-04-08 NOTE — Assessment & Plan Note (Signed)
minimize simple carbs. Increase exercise as tolerated.  

## 2017-05-16 DIAGNOSIS — Z23 Encounter for immunization: Secondary | ICD-10-CM | POA: Diagnosis not present

## 2017-10-02 ENCOUNTER — Ambulatory Visit: Payer: 59 | Admitting: Family Medicine

## 2018-03-14 ENCOUNTER — Encounter

## 2018-03-14 ENCOUNTER — Encounter: Payer: 59 | Admitting: Family Medicine

## 2018-03-25 ENCOUNTER — Encounter: Payer: Self-pay | Admitting: Family Medicine

## 2018-03-25 ENCOUNTER — Ambulatory Visit: Payer: 59 | Admitting: Family Medicine

## 2018-03-25 VITALS — BP 160/88 | HR 87 | Temp 98.8°F | Resp 16 | Ht 66.0 in | Wt 141.2 lb

## 2018-03-25 DIAGNOSIS — E785 Hyperlipidemia, unspecified: Secondary | ICD-10-CM | POA: Diagnosis not present

## 2018-03-25 DIAGNOSIS — F17200 Nicotine dependence, unspecified, uncomplicated: Secondary | ICD-10-CM

## 2018-03-25 DIAGNOSIS — R002 Palpitations: Secondary | ICD-10-CM | POA: Diagnosis not present

## 2018-03-25 DIAGNOSIS — F329 Major depressive disorder, single episode, unspecified: Secondary | ICD-10-CM

## 2018-03-25 DIAGNOSIS — R350 Frequency of micturition: Secondary | ICD-10-CM | POA: Diagnosis not present

## 2018-03-25 DIAGNOSIS — F419 Anxiety disorder, unspecified: Secondary | ICD-10-CM

## 2018-03-25 DIAGNOSIS — R739 Hyperglycemia, unspecified: Secondary | ICD-10-CM

## 2018-03-25 DIAGNOSIS — F32A Depression, unspecified: Secondary | ICD-10-CM

## 2018-03-25 MED ORDER — ALPRAZOLAM 0.25 MG PO TABS: 0.2500 mg | ORAL_TABLET | Freq: Two times a day (BID) | ORAL | 2 refills | Status: DC | PRN

## 2018-03-25 MED ORDER — METOPROLOL SUCCINATE ER 25 MG PO TB24: 25.0000 mg | ORAL_TABLET | Freq: Every day | ORAL | 3 refills | Status: DC

## 2018-03-25 NOTE — Assessment & Plan Note (Addendum)
Does not want to start a daily SSRI for now. Will allow a small dose of a Benzodiazepine to use prn. She is trying to juggle the care of he long term partner as she undergoes treatments for stage 4 cancer. She is not having an easy time with her patrners family. Counseled for 30 minutes during visit.

## 2018-03-25 NOTE — Patient Instructions (Signed)
Drink 60-80 oz of clear fluids daily Encouraged increased hydration, 64 ounces of clear fluids daily. Minimize alcohol and caffeine. Eat small frequent meals with lean proteins and complex carbs. Avoid high and low blood sugars. Get adequate sleep, 7-8 hours a night. Needs exercise daily preferably in the morning. Tension Headache A tension headache is a feeling of pain, pressure, or aching that is often felt over the front and sides of the head. The pain can be dull, or it can feel tight (constricting). Tension headaches are not normally associated with nausea or vomiting, and they do not get worse with physical activity. Tension headaches can last from 30 minutes to several days. This is the most common type of headache. CAUSES The exact cause of this condition is not known. Tension headaches often begin after stress, anxiety, or depression. Other triggers may include:  Alcohol.  Too much caffeine, or caffeine withdrawal.  Respiratory infections, such as colds, flu, or sinus infections.  Dental problems or teeth clenching.  Fatigue.  Holding your head and neck in the same position for a long period of time, such as while using a computer.  Smoking. SYMPTOMS Symptoms of this condition include:  A feeling of pressure around the head.  Dull, aching head pain.  Pain felt over the front and sides of the head.  Tenderness in the muscles of the head, neck, and shoulders. DIAGNOSIS This condition may be diagnosed based on your symptoms and a physical exam. Tests may be done, such as a CT scan or an MRI of your head. These tests may be done if your symptoms are severe or unusual. TREATMENT This condition may be treated with lifestyle changes and medicines to help relieve symptoms. HOME CARE INSTRUCTIONS Managing Pain  Take over-the-counter and prescription medicines only as told by your health care provider.  Lie down in a dark, quiet room when you have a headache.  If directed,  apply ice to the head and neck area: ? Put ice in a plastic bag. ? Place a towel between your skin and the bag. ? Leave the ice on for 20 minutes, 2-3 times per day.  Use a heating pad or a hot shower to apply heat to the head and neck area as told by your health care provider. Eating and Drinking  Eat meals on a regular schedule.  Limit alcohol use.  Decrease your caffeine intake, or stop using caffeine. General Instructions  Keep all follow-up visits as told by your health care provider. This is important.  Keep a headache journal to help find out what may trigger your headaches. For example, write down: ? What you eat and drink. ? How much sleep you get. ? Any change to your diet or medicines.  Try massage or other relaxation techniques.  Limit stress.  Sit up straight, and avoid tensing your muscles.  Do not use tobacco products, including cigarettes, chewing tobacco, or e-cigarettes. If you need help quitting, ask your health care provider.  Exercise regularly as told by your health care provider.  Get 7-9 hours of sleep, or the amount recommended by your health care provider. SEEK MEDICAL CARE IF:  Your symptoms are not helped by medicine.  You have a headache that is different from what you normally experience.  You have nausea or you vomit.  You have a fever. SEEK IMMEDIATE MEDICAL CARE IF:  Your headache becomes severe.  You have repeated vomiting.  You have a stiff neck.  You have a loss of  vision.  You have problems with speech.  You have pain in your eye or ear.  You have muscular weakness or loss of muscle control.  You lose your balance or you have trouble walking.  You feel faint or you pass out.  You have confusion. This information is not intended to replace advice given to you by your health care provider. Make sure you discuss any questions you have with your health care provider. Document Released: 06/19/2005 Document Revised:  03/10/2015 Document Reviewed: 10/12/2014 Elsevier Interactive Patient Education  2017 [employer]

## 2018-03-25 NOTE — Assessment & Plan Note (Signed)
Encouraged complete cessation. Discussed need to quit as relates to risk of numerous cancers, cardiac and pulmonary disease as well as neurologic complications. Counseled for greater than 3 minutes 

## 2018-03-25 NOTE — Assessment & Plan Note (Signed)
Encouraged heart healthy diet, increase exercise, avoid trans fats, consider a krill oil cap daily 

## 2018-03-25 NOTE — Assessment & Plan Note (Signed)
minimize simple carbs. Increase exercise as tolerated.  

## 2018-03-26 ENCOUNTER — Telehealth: Payer: Self-pay | Admitting: *Deleted

## 2018-03-26 LAB — COMPREHENSIVE METABOLIC PANEL
ALBUMIN: 4.3 g/dL (ref 3.5–5.2)
ALK PHOS: 60 U/L (ref 39–117)
ALT: 9 U/L (ref 0–35)
AST: 11 U/L (ref 0–37)
BILIRUBIN TOTAL: 0.3 mg/dL (ref 0.2–1.2)
BUN: 13 mg/dL (ref 6–23)
CALCIUM: 10.3 mg/dL (ref 8.4–10.5)
CO2: 27 mEq/L (ref 19–32)
CREATININE: 0.75 mg/dL (ref 0.40–1.20)
Chloride: 104 mEq/L (ref 96–112)
GFR: 84.66 mL/min (ref >60.00)
Glucose, Bld: 77 mg/dL (ref 70–99)
Potassium: 4.4 mEq/L (ref 3.5–5.1)
Sodium: 139 mEq/L (ref 135–145)
TOTAL PROTEIN: 7.1 g/dL (ref 6.0–8.3)

## 2018-03-26 LAB — LIPID PANEL
CHOLESTEROL: 337 mg/dL — AB (ref 0–200)
HDL: 53.2 mg/dL (ref >39.00)
NonHDL: 283.69
TRIGLYCERIDES: 230 mg/dL — AB (ref 0.0–149.0)
Total CHOL/HDL Ratio: 6
VLDL: 46 mg/dL — ABNORMAL HIGH (ref 0.0–40.0)

## 2018-03-26 LAB — URINE CULTURE
MICRO NUMBER: 91139767
SPECIMEN QUALITY: ADEQUATE

## 2018-03-26 LAB — CBC
HCT: 42 % (ref 36.0–46.0)
Hemoglobin: 13.8 g/dL (ref 12.0–15.0)
MCHC: 32.9 g/dL (ref 30.0–36.0)
MCV: 89.4 fl (ref 78.0–100.0)
PLATELETS: 410 10*3/uL — AB (ref 150.0–400.0)
RBC: 4.7 Mil/uL (ref 3.87–5.11)
RDW: 14.2 % (ref 11.5–15.5)
WBC: 9.7 10*3/uL (ref 4.0–10.5)

## 2018-03-26 LAB — URINALYSIS
Bilirubin Urine: NEGATIVE
HGB URINE DIPSTICK: NEGATIVE
Ketones, ur: NEGATIVE
LEUKOCYTES UA: NEGATIVE
NITRITE: NEGATIVE
SPECIFIC GRAVITY, URINE: 1.01 (ref 1.000–1.030)
Total Protein, Urine: NEGATIVE
UROBILINOGEN UA: 0.2 (ref 0.0–1.0)
Urine Glucose: NEGATIVE
pH: 6.5 (ref 5.0–8.0)

## 2018-03-26 LAB — HEMOGLOBIN A1C: Hgb A1c MFr Bld: 6 % (ref 4.6–6.5)

## 2018-03-26 LAB — LDL CHOLESTEROL, DIRECT: Direct LDL: 237 mg/dL

## 2018-03-26 LAB — TSH: TSH: 1.14 u[IU]/mL (ref 0.35–4.50)

## 2018-03-26 NOTE — Telephone Encounter (Signed)
Received Cologuard Incomplete Order: [id number]; order has been faxed back d/t missing information necessary to send patient a Cologuard kit. [employer] has made several attempts to reach patient by phone and has not been able to reach them; forwarded to provider/SLS 09/24

## 2018-03-27 MED ORDER — ATORVASTATIN CALCIUM 10 MG PO TABS: 10.0000 mg | ORAL_TABLET | Freq: Every day | ORAL | 2 refills | Status: DC

## 2018-03-27 NOTE — Progress Notes (Signed)
Subjective:    Patient ID: Molly Snyder, female    DOB: [birth year], 57 y.o.   MRN: [id number]  Chief Complaint  Patient presents with  . Stress    Patient reports going through dificcult situation with her partner's new cancer diagnosis.     HPI Patient is in today for follow up and is struggling with a great deal of stress. Her long term partner is struggling with advanced cancer and the patient is her care giver while trying to maintain full time employment. No recent febrile illness or hospitalizations. She is having anxiety attacks with palpitations, acing thoughts and tremulousness. Notes anhedonia but no suicidal ideation. Denies CP/SOB/HA/congestion/fevers/GI or GU c/o. Taking meds as prescribed  Past Medical History:  Diagnosis Date  . Allergic state 10/25/2014  . Breast lesion 09/02/2014   Left axillae   . Chest pain 09/02/2014  . Hyperlipidemia, mixed 10/25/2014  . Right bundle branch block   . Snoring 09/02/2014  . Tobacco use disorder 09/02/2014    Past Surgical History:  Procedure Laterality Date  . pylonidal cystectomy      Family History  Problem Relation Age of Onset  . Heart disease Father     Social History   Socioeconomic History  . Marital status: Single    Spouse name: Not on file  . Number of children: Not on file  . Years of education: Not on file  . Highest education level: Not on file  Occupational History  . Not on file  Social Needs  . Financial resource strain: Not on file  . Food insecurity:    Worry: Not on file    Inability: Not on file  . Transportation needs:    Medical: Not on file    Non-medical: Not on file  Tobacco Use  . Smoking status: Current Some Day Smoker    Types: Cigarettes  . Smokeless tobacco: Never Used  Substance and Sexual Activity  . Alcohol use: Yes    Comment: 1 beer once a month  . Drug use: No  . Sexual activity: Yes    Birth control/protection: None  Lifestyle  . Physical activity:    Days per week:  Not on file    Minutes per session: Not on file  . Stress: Not on file  Relationships  . Social connections:    Talks on phone: Not on file    Gets together: Not on file    Attends religious service: Not on file    Active member of club or organization: Not on file    Attends meetings of clubs or organizations: Not on file    Relationship status: Not on file  . Intimate partner violence:    Fear of current or ex partner: Not on file    Emotionally abused: Not on file    Physically abused: Not on file    Forced sexual activity: Not on file  Other Topics Concern  . Not on file  Social History Narrative  . Not on file    Outpatient Medications Prior to Visit  Medication Sig Dispense Refill  . loratadine (CLARITIN) 10 MG tablet Take 10 mg by mouth daily.    Marland Kitchen atorvastatin (LIPITOR) 10 MG tablet Take 1 tablet (10 mg total) by mouth daily. (Patient not taking: Reported on 03/25/2018) 90 tablet 3   No facility-administered medications prior to visit.     Allergies  Allergen Reactions  . Penicillins Anaphylaxis  . Amoxicillin Rash    Review of  Systems  Constitutional: Positive for malaise/fatigue. Negative for fever.  HENT: Negative for congestion.   Eyes: Negative for blurred vision.  Respiratory: Negative for shortness of breath.   Cardiovascular: Negative for chest pain, palpitations and leg swelling.  Gastrointestinal: Negative for abdominal pain, blood in stool and nausea.  Genitourinary: Negative for dysuria and frequency.  Musculoskeletal: Negative for falls.  Skin: Negative for rash.  Neurological: Negative for dizziness, loss of consciousness and headaches.  Endo/Heme/Allergies: Negative for environmental allergies.  Psychiatric/Behavioral: Positive for depression. The patient is nervous/anxious.        Objective:    Physical Exam  Constitutional: She is oriented to person, place, and time. She appears well-developed and well-nourished. No distress.  HENT:    Head: Normocephalic and atraumatic.  Nose: Nose normal.  Eyes: Right eye exhibits no discharge. Left eye exhibits no discharge.  Neck: Normal range of motion. Neck supple.  Cardiovascular: Normal rate and regular rhythm.  No murmur heard. Pulmonary/Chest: Effort normal and breath sounds normal.  Abdominal: Soft. Bowel sounds are normal. There is no tenderness.  Musculoskeletal: She exhibits no edema.  Neurological: She is alert and oriented to person, place, and time.  Skin: Skin is warm and dry.  Psychiatric: She has a normal mood and affect.  Nursing note and vitals reviewed.   BP (!) 160/88 (BP Location: Right Arm, Patient Position: Sitting, Cuff Size: Small)   Pulse 87   Temp 98.8 F (37.1 C) (Oral)   Resp 16   Ht 5\' 6"  (1.676 m)   Wt 141 lb 3.2 oz (64 kg)   SpO2 97%   BMI 22.79 kg/m  Wt Readings from Last 3 Encounters:  03/25/18 141 lb 3.2 oz (64 kg)  04/03/17 145 lb 9.6 oz (66 kg)  10/20/14 153 lb 4 oz (69.5 kg)     Lab Results  Component Value Date   WBC 9.7 03/25/2018   HGB 13.8 03/25/2018   HCT 42.0 03/25/2018   PLT 410.0 (H) 03/25/2018   GLUCOSE 77 03/25/2018   CHOL 337 (H) 03/25/2018   TRIG 230.0 (H) 03/25/2018   HDL 53.20 03/25/2018   LDLDIRECT 237.0 03/25/2018   LDLCALC 242 (H) 04/04/2017   ALT 9 03/25/2018   AST 11 03/25/2018   NA 139 03/25/2018   K 4.4 03/25/2018   CL 104 03/25/2018   CREATININE 0.75 03/25/2018   BUN 13 03/25/2018   CO2 27 03/25/2018   TSH 1.14 03/25/2018   HGBA1C 6.0 03/25/2018    Lab Results  Component Value Date   TSH 1.14 03/25/2018   Lab Results  Component Value Date   WBC 9.7 03/25/2018   HGB 13.8 03/25/2018   HCT 42.0 03/25/2018   MCV 89.4 03/25/2018   PLT 410.0 (H) 03/25/2018   Lab Results  Component Value Date   NA 139 03/25/2018   K 4.4 03/25/2018   CO2 27 03/25/2018   GLUCOSE 77 03/25/2018   BUN 13 03/25/2018   CREATININE 0.75 03/25/2018   BILITOT 0.3 03/25/2018   ALKPHOS 60 03/25/2018   AST 11  03/25/2018   ALT 9 03/25/2018   PROT 7.1 03/25/2018   ALBUMIN 4.3 03/25/2018   CALCIUM 10.3 03/25/2018   GFR 84.66 03/25/2018   Lab Results  Component Value Date   CHOL 337 (H) 03/25/2018   Lab Results  Component Value Date   HDL 53.20 03/25/2018   Lab Results  Component Value Date   LDLCALC 242 (H) 04/04/2017   Lab Results  Component Value  Date   TRIG 230.0 (H) 03/25/2018   Lab Results  Component Value Date   CHOLHDL 6 03/25/2018   Lab Results  Component Value Date   HGBA1C 6.0 03/25/2018       Assessment & Plan:   Problem List Items Addressed This Visit    Urinary frequency - Primary   Relevant Orders   CBC (Completed)   Urinalysis (Completed)   Urine Culture (Completed)   Anxiety and depression    Does not want to start a daily SSRI for now. Will allow a small dose of a Benzodiazepine to use prn. She is trying to juggle the care of he long term partner as she undergoes treatments for stage 4 cancer. She is not having an easy time with her patrners family. Counseled for 30 minutes during visit.       Relevant Medications   ALPRAZolam (XANAX) 0.25 MG tablet   Tobacco use disorder    Encouraged complete cessation. Discussed need to quit as relates to risk of numerous cancers, cardiac and pulmonary disease as well as neurologic complications. Counseled for greater than 3 minutes      Heart palpitations   Relevant Orders   TSH (Completed)   Hyperlipidemia    Encouraged heart healthy diet, increase exercise, avoid trans fats, consider a krill oil cap daily      Relevant Medications   metoprolol succinate (TOPROL-XL) 25 MG 24 hr tablet   atorvastatin (LIPITOR) 10 MG tablet   Other Relevant Orders   Lipid panel (Completed)   Hyperglycemia    minimize simple carbs. Increase exercise as tolerated.       Relevant Orders   Hemoglobin A1c (Completed)   Comprehensive metabolic panel (Completed)      I am having Makaley A. Rueter start on metoprolol  succinate and ALPRAZolam. I am also having her maintain her loratadine and atorvastatin.  Meds ordered this encounter  Medications  . metoprolol succinate (TOPROL-XL) 25 MG 24 hr tablet    Sig: Take 1 tablet (25 mg total) by mouth daily.    Dispense:  90 tablet    Refill:  3  . ALPRAZolam (XANAX) 0.25 MG tablet    Sig: Take 1 tablet (0.25 mg total) by mouth 2 (two) times daily as needed for anxiety.    Dispense:  30 tablet    Refill:  2  . atorvastatin (LIPITOR) 10 MG tablet    Sig: Take 1 tablet (10 mg total) by mouth daily.    Dispense:  30 tablet    Refill:  2     Danise Edge, MD

## 2018-04-02 ENCOUNTER — Telehealth: Payer: Self-pay | Admitting: *Deleted

## 2018-04-02 NOTE — Telephone Encounter (Signed)
Received Cologuard Incomplete Order: [id number]; order has been faxed back d/t missing information necessary to send patient a Cologuard kit. [employer] has made several attempts to reach patient by phone and has not been able to reach them; forwarded to provider/SLS  10/01

## 2018-04-03 ENCOUNTER — Telehealth: Payer: Self-pay | Admitting: *Deleted

## 2018-04-03 NOTE — Telephone Encounter (Signed)
Received Cologuard Incomplete Order: [id number]; order has been faxed back d/t missing information necessary to send patient a Cologuard kit. [employer] has made several attempts to reach patient by phone and has not been able to reach them; forwarded to provider/SLS 10/02

## 2018-04-15 NOTE — Telephone Encounter (Signed)
[employer] calling because the order needs an ICD-10 code and providers signature before patient can get the cologaurd test completed. Fax:[phone]

## 2018-04-16 NOTE — Telephone Encounter (Signed)
Information has been sent in

## 2018-05-27 ENCOUNTER — Telehealth: Payer: Self-pay | Admitting: *Deleted

## 2018-05-27 ENCOUNTER — Ambulatory Visit: Payer: 59 | Admitting: Family Medicine

## 2018-05-27 NOTE — Telephone Encounter (Signed)
Received Cologuard Order Cancellation: [id number]: [id number]; order has been changed to Suspended for Inactivity, the order will be reactivated if patient returns their sample w/i 365 days of the Initial order. [employer] has made several attempts to reach patient by phone and letter unsuccessfully; forwarded to provider/SLS 11/25

## 2018-06-13 ENCOUNTER — Ambulatory Visit: Payer: 59 | Admitting: Family Medicine

## 2018-06-13 ENCOUNTER — Encounter: Payer: Self-pay | Admitting: Family Medicine

## 2018-06-13 VITALS — BP 102/62 | HR 63 | Temp 97.8°F | Resp 18 | Ht 66.0 in | Wt 145.8 lb

## 2018-06-13 DIAGNOSIS — F419 Anxiety disorder, unspecified: Secondary | ICD-10-CM

## 2018-06-13 DIAGNOSIS — R079 Chest pain, unspecified: Secondary | ICD-10-CM | POA: Diagnosis not present

## 2018-06-13 DIAGNOSIS — F32A Depression, unspecified: Secondary | ICD-10-CM

## 2018-06-13 DIAGNOSIS — E785 Hyperlipidemia, unspecified: Secondary | ICD-10-CM | POA: Diagnosis not present

## 2018-06-13 DIAGNOSIS — R002 Palpitations: Secondary | ICD-10-CM | POA: Diagnosis not present

## 2018-06-13 DIAGNOSIS — F17200 Nicotine dependence, unspecified, uncomplicated: Secondary | ICD-10-CM | POA: Diagnosis not present

## 2018-06-13 DIAGNOSIS — F329 Major depressive disorder, single episode, unspecified: Secondary | ICD-10-CM

## 2018-06-13 MED ORDER — ASPIRIN EC 81 MG PO TBEC: 81.0000 mg | DELAYED_RELEASE_TABLET | Freq: Every day | ORAL | Status: AC

## 2018-06-13 MED ORDER — NITROGLYCERIN 0.4 MG SL SUBL: 0.4000 mg | SUBLINGUAL_TABLET | SUBLINGUAL | 3 refills | Status: DC | PRN

## 2018-06-13 NOTE — Patient Instructions (Signed)

## 2018-06-16 NOTE — Assessment & Plan Note (Addendum)
Has had some recent episodes of chest pressure recently resolves without other symptoms but with risk factors will refer to cardiology for consideration of stress testing she should seek care if symptoms return and do not resolve with NTG. EKG unchanged shows R=incomplete RBB, take Aspirin daily for now. Spent 40 minutes with patient in evaluation, treatment and care coordination, face to face.

## 2018-06-16 NOTE — Assessment & Plan Note (Signed)
Still under a great deal of stress dealing with her partners family. Declines counseling but may continue Alprazolam infrequently at this time.

## 2018-06-16 NOTE — Assessment & Plan Note (Signed)
Needs complete cessation to decrease risk factors but unable thus far

## 2018-06-16 NOTE — Progress Notes (Signed)
Subjective:    Patient ID: Molly Snyder, female    DOB: 1961/03/18, 57 y.o.   MRN: [id number]  No chief complaint on file.   HPI Patient is in today for follow-up and is not doing well.  She continues to struggle with fatigue, malaise, anxiety and depression.  Continues to have a strained relationship with her very ill partner's family.  Is frustrated with the way her partner is handling it as well.  She is noting ongoing headaches most notably on the right side and worse in the morning.  She has a sense of weakness and malaise even a sense of presyncope at times.  Most notably she is also having some atypical chest pain and pressure intermittently.  No significant pattern although it does not awaken her from sleep.  She has palpitations as well but not usually at the same time.  No recent febrile illness or hospitalization. Denies SOB/HA/congestion/fevers/GI or GU c/o. Taking meds as prescribed  Past Medical History:  Diagnosis Date  . Allergic state 10/25/2014  . Breast lesion 09/02/2014   Left axillae   . Chest pain 09/02/2014  . Hyperlipidemia, mixed 10/25/2014  . Right bundle branch block   . Snoring 09/02/2014  . Tobacco use disorder 09/02/2014    Past Surgical History:  Procedure Laterality Date  . pylonidal cystectomy      Family History  Problem Relation Age of Onset  . Heart disease Father     Social History   Socioeconomic History  . Marital status: Single    Spouse name: Not on file  . Number of children: Not on file  . Years of education: Not on file  . Highest education level: Not on file  Occupational History  . Not on file  Social Needs  . Financial resource strain: Not on file  . Food insecurity:    Worry: Not on file    Inability: Not on file  . Transportation needs:    Medical: Not on file    Non-medical: Not on file  Tobacco Use  . Smoking status: Current Some Day Smoker    Types: Cigarettes  . Smokeless tobacco: Never Used  Substance and Sexual  Activity  . Alcohol use: Yes    Comment: 1 beer once a month  . Drug use: No  . Sexual activity: Yes    Birth control/protection: None  Lifestyle  . Physical activity:    Days per week: Not on file    Minutes per session: Not on file  . Stress: Not on file  Relationships  . Social connections:    Talks on phone: Not on file    Gets together: Not on file    Attends religious service: Not on file    Active member of club or organization: Not on file    Attends meetings of clubs or organizations: Not on file    Relationship status: Not on file  . Intimate partner violence:    Fear of current or ex partner: Not on file    Emotionally abused: Not on file    Physically abused: Not on file    Forced sexual activity: Not on file  Other Topics Concern  . Not on file  Social History Narrative  . Not on file    Outpatient Medications Prior to Visit  Medication Sig Dispense Refill  . ALPRAZolam (XANAX) 0.25 MG tablet Take 1 tablet (0.25 mg total) by mouth 2 (two) times daily as needed for anxiety. 30 tablet  2  . atorvastatin (LIPITOR) 10 MG tablet Take 1 tablet (10 mg total) by mouth daily. 30 tablet 2  . loratadine (CLARITIN) 10 MG tablet Take 10 mg by mouth daily.    . metoprolol succinate (TOPROL-XL) 25 MG 24 hr tablet Take 1 tablet (25 mg total) by mouth daily. 90 tablet 3   No facility-administered medications prior to visit.     Allergies  Allergen Reactions  . Penicillins Anaphylaxis  . Amoxicillin Rash    Review of Systems  Constitutional: Positive for malaise/fatigue. Negative for fever.  HENT: Negative for congestion.   Eyes: Negative for blurred vision.  Respiratory: Negative for shortness of breath.   Cardiovascular: Positive for chest pain and palpitations. Negative for leg swelling.  Gastrointestinal: Negative for abdominal pain, blood in stool and nausea.  Genitourinary: Negative for dysuria and frequency.  Musculoskeletal: Negative for falls.  Skin: Negative  for rash.  Neurological: Negative for dizziness, loss of consciousness and headaches.  Endo/Heme/Allergies: Negative for environmental allergies.  Psychiatric/Behavioral: Positive for depression. Negative for suicidal ideas. The patient is nervous/anxious.        Objective:    Physical Exam Vitals signs and nursing note reviewed.  Constitutional:      General: She is not in acute distress.    Appearance: She is well-developed.  HENT:     Head: Normocephalic and atraumatic.     Nose: Nose normal.  Eyes:     General:        Right eye: No discharge.        Left eye: No discharge.  Neck:     Musculoskeletal: Normal range of motion and neck supple.  Cardiovascular:     Rate and Rhythm: Normal rate and regular rhythm.     Heart sounds: No murmur.  Pulmonary:     Effort: Pulmonary effort is normal.     Breath sounds: Normal breath sounds.  Abdominal:     General: Bowel sounds are normal.     Palpations: Abdomen is soft.     Tenderness: There is no abdominal tenderness.  Skin:    General: Skin is warm and dry.  Neurological:     Mental Status: She is alert and oriented to person, place, and time.     BP 102/62 (BP Location: Left Arm, Patient Position: Sitting, Cuff Size: Normal)   Pulse 63   Temp 97.8 F (36.6 C) (Oral)   Resp 18   Ht 5\' 6"  (1.676 m)   Wt 145 lb 12.8 oz (66.1 kg)   SpO2 99%   BMI 23.53 kg/m  Wt Readings from Last 3 Encounters:  06/13/18 145 lb 12.8 oz (66.1 kg)  03/25/18 141 lb 3.2 oz (64 kg)  04/03/17 145 lb 9.6 oz (66 kg)     Lab Results  Component Value Date   WBC 9.7 03/25/2018   HGB 13.8 03/25/2018   HCT 42.0 03/25/2018   PLT 410.0 (H) 03/25/2018   GLUCOSE 77 03/25/2018   CHOL 337 (H) 03/25/2018   TRIG 230.0 (H) 03/25/2018   HDL 53.20 03/25/2018   LDLDIRECT 237.0 03/25/2018   LDLCALC 242 (H) 04/04/2017   ALT 9 03/25/2018   AST 11 03/25/2018   NA 139 03/25/2018   K 4.4 03/25/2018   CL 104 03/25/2018   CREATININE 0.75 03/25/2018    BUN 13 03/25/2018   CO2 27 03/25/2018   TSH 1.14 03/25/2018   HGBA1C 6.0 03/25/2018    Lab Results  Component Value Date   TSH  1.14 03/25/2018   Lab Results  Component Value Date   WBC 9.7 03/25/2018   HGB 13.8 03/25/2018   HCT 42.0 03/25/2018   MCV 89.4 03/25/2018   PLT 410.0 (H) 03/25/2018   Lab Results  Component Value Date   NA 139 03/25/2018   K 4.4 03/25/2018   CO2 27 03/25/2018   GLUCOSE 77 03/25/2018   BUN 13 03/25/2018   CREATININE 0.75 03/25/2018   BILITOT 0.3 03/25/2018   ALKPHOS 60 03/25/2018   AST 11 03/25/2018   ALT 9 03/25/2018   PROT 7.1 03/25/2018   ALBUMIN 4.3 03/25/2018   CALCIUM 10.3 03/25/2018   GFR 84.66 03/25/2018   Lab Results  Component Value Date   CHOL 337 (H) 03/25/2018   Lab Results  Component Value Date   HDL 53.20 03/25/2018   Lab Results  Component Value Date   LDLCALC 242 (H) 04/04/2017   Lab Results  Component Value Date   TRIG 230.0 (H) 03/25/2018   Lab Results  Component Value Date   CHOLHDL 6 03/25/2018   Lab Results  Component Value Date   HGBA1C 6.0 03/25/2018       Assessment & Plan:   Problem List Items Addressed This Visit    Anxiety and depression    Still under a great deal of stress dealing with her partners family. Declines counseling but may continue Alprazolam infrequently at this time.      Chest pain    Has had some recent episodes of chest pressure recently resolves without other symptoms but with risk factors will refer to cardiology for consideration of stress testing she should seek care if symptoms return and do not resolve with NTG. EKG unchanged shows R=incomplete RBB, take Aspirin daily for now. Spent 40 minutes with patient in evaluation, treatment and care coordination, face to face.       Relevant Orders   Ambulatory referral to Cardiology   Tobacco use disorder    Needs complete cessation to decrease risk factors but unable thus far      Hyperlipidemia   Relevant Medications    aspirin EC 81 MG tablet   nitroGLYCERIN (NITROSTAT) 0.4 MG SL tablet   Other Relevant Orders   Lipid panel    Other Visit Diagnoses    Palpitations    -  Primary   Relevant Orders   EKG 12-Lead   EKG 12-Lead (Completed)   Comprehensive metabolic panel      I am having Dannia A. Oler start on aspirin EC and nitroGLYCERIN. I am also having her maintain her loratadine, metoprolol succinate, ALPRAZolam, and atorvastatin.  Meds ordered this encounter  Medications  . aspirin EC 81 MG tablet    Sig: Take 1 tablet (81 mg total) by mouth daily.  . nitroGLYCERIN (NITROSTAT) 0.4 MG SL tablet    Sig: Place 1 tablet (0.4 mg total) under the tongue every 5 (five) minutes as needed for chest pain.    Dispense:  25 tablet    Refill:  3      Danise Edge, MD

## 2018-06-30 NOTE — Progress Notes (Signed)
Cardiology Office Note:    Date:  07/01/2018   ID:  Molly Snyder, DOB 06-16-1961, MRN [id number]  PCP:  [name], MD  Cardiologist:  Norman Herrlich, MD   Referring MD: [name], MD  ASSESSMENT:    1. Chest pain in adult   2. Familial hypercholesterolemia    PLAN:    In order of problems listed above:  1. She is at high risk group is having anginal symptoms increasing in frequency should continue current treatment which includes aspirin recently started high intensity statin beta-blocker and will add calcium channel blocker for antianginal effect she may needs an ischemia evaluation will undergo cardiac CTA and will reassess in the office afterwards.  She does have nitroglycerin that she can take as needed. 2. She is on a high intensity statin likely need a second agent either Zetia or PCSK9 and in all likelihood the injectable molecule will give her a better long-term result.  Next appointment after cardiac CTA 4 weeks   Medication Adjustments/Labs and Tests Ordered: Current medicines are reviewed at length with the patient today.  Concerns regarding medicines are outlined above.  Orders Placed This Encounter  Procedures  . CT CORONARY MORPH W/CTA COR W/SCORE W/CA W/CM &/OR WO/CM  . CT CORONARY FRACTIONAL FLOW RESERVE DATA PREP  . CT CORONARY FRACTIONAL FLOW RESERVE FLUID ANALYSIS  . Basic Metabolic Panel (BMET)   Meds ordered this encounter  Medications  . amLODipine (NORVASC) 2.5 MG tablet    Sig: Take 1 tablet (2.5 mg total) by mouth daily.    Dispense:  30 tablet    Refill:  3     Chief Complaint  Patient presents with  . Chest Pain  . Hyperlipidemia    History of Present Illness:    Molly Snyder is a 57 y.o. female who is being seen today for the evaluation of chest pain at the request of [name], MD.  Is a history of known right bundle branch block long-term.  She has had a history of severe hyperlipidemia since age 12 never  treated until recently.  She has had chest pain for decades underwent coronary angiography in the early 1990s [place] normal coronary angiography.  She was given nitroglycerin at one time and takes it occasionally and has a pattern of angina precipitated by emotional stress relieved with nitroglycerin.  Unfortunately she has a severe headache with it.  The symptoms have flared it occurs several times a week chest pain which she describes as substernal pressure using a Levine sign can last for up to an hour and she is hesitant to take nitroglycerin due to migraine headache.  Is not exertional in nature no shortness of breath or syncope palpitations present not severe sustained.  We discussed options for further evaluation she undergo cardiac CTA for risk assessment encouraged her to continue high intensity statin recently started and likely require 2 drugs either new PCSK9 or Zetia and reassess in the office after CTA is performed I placed her on long-acting calcium channel blocker for antianginal effect. Past Medical History:  Diagnosis Date  . Allergic state 10/25/2014  . Breast lesion 09/02/2014   Left axillae   . Chest pain 09/02/2014  . Hyperlipidemia, mixed 10/25/2014  . Right bundle branch block   . Snoring 09/02/2014  . Tobacco use disorder 09/02/2014    Past Surgical History:  Procedure Laterality Date  . pylonidal cystectomy      Current Medications: Current Meds  Medication Sig  . ALPRAZolam (XANAX) 0.25 MG tablet Take 1 tablet (0.25 mg total) by mouth 2 (two) times daily as needed for anxiety.  Marland Kitchen. aspirin EC 81 MG tablet Take 1 tablet (81 mg total) by mouth daily.  Marland Kitchen. atorvastatin (LIPITOR) 10 MG tablet Take 1 tablet (10 mg total) by mouth daily.  Marland Kitchen. loratadine (CLARITIN) 10 MG tablet Take 10 mg by mouth daily.  . metoprolol succinate (TOPROL-XL) 25 MG 24 hr tablet Take 1 tablet (25 mg total) by mouth daily.  . nitroGLYCERIN (NITROSTAT) 0.4 MG SL tablet Place 1 tablet (0.4 mg total) under  the tongue every 5 (five) minutes as needed for chest pain.     Allergies:   Penicillins and Amoxicillin   Social History   Socioeconomic History  . Marital status: Single    Spouse name: Not on file  . Number of children: Not on file  . Years of education: Not on file  . Highest education level: Not on file  Occupational History  . Not on file  Social Needs  . Financial resource strain: Not on file  . Food insecurity:    Worry: Not on file    Inability: Not on file  . Transportation needs:    Medical: Not on file    Non-medical: Not on file  Tobacco Use  . Smoking status: Current Some Day Smoker    Types: Cigarettes  . Smokeless tobacco: Never Used  Substance and Sexual Activity  . Alcohol use: Not Currently  . Drug use: No  . Sexual activity: Yes    Birth control/protection: None  Lifestyle  . Physical activity:    Days per week: Not on file    Minutes per session: Not on file  . Stress: Not on file  Relationships  . Social connections:    Talks on phone: Not on file    Gets together: Not on file    Attends religious service: Not on file    Active member of club or organization: Not on file    Attends meetings of clubs or organizations: Not on file    Relationship status: Not on file  Other Topics Concern  . Not on file  Social History Narrative  . Not on file     Family History: The patient's family history includes Heart attack in her father, maternal uncle, and paternal grandfather; Heart disease in her father; Heart murmur in her mother; Throat cancer in her mother.  ROS:   Review of Systems  Constitution: Positive for malaise/fatigue.  HENT: Negative.   Eyes: Negative.   Cardiovascular: Positive for chest pain and palpitations.  Respiratory: Negative.   Endocrine: Negative.   Hematologic/Lymphatic: Negative.   Skin: Negative.   Musculoskeletal: Negative.   Gastrointestinal: Negative.   Genitourinary: Negative.   Neurological: Negative.     Psychiatric/Behavioral: The patient is nervous/anxious.   Allergic/Immunologic: Negative.    Please see the history of present illness.     All other systems reviewed and are negative.  EKGs/Labs/Other Studies Reviewed:    The following studies were reviewed today:   EKG:  EKG  06/14/18 [facility] LAE RBBB  Recent Labs: 03/25/2018: ALT 9; BUN 13; Creatinine, Ser 0.75; Hemoglobin 13.8; Platelets 410.0; Potassium 4.4; Sodium 139; TSH 1.14  Recent Lipid Panel    Component Value Date/Time   CHOL 337 (H) 03/25/2018 1526   TRIG 230.0 (H) 03/25/2018 1526   HDL 53.20 03/25/2018 1526   CHOLHDL 6 03/25/2018 1526   VLDL 46.0 (  H) 03/25/2018 1526   LDLCALC 242 (H) 04/04/2017 0941   LDLDIRECT 237.0 03/25/2018 1526    Physical Exam:    VS:  BP 124/82 (BP Location: Left Arm, Patient Position: Sitting, Cuff Size: Normal)   Pulse 70   Ht 5\' 6"  (1.676 m)   Wt 142 lb 1.9 oz (64.5 kg)   SpO2 98%   BMI 22.94 kg/m     Wt Readings from Last 3 Encounters:  07/01/18 142 lb 1.9 oz (64.5 kg)  06/13/18 145 lb 12.8 oz (66.1 kg)  03/25/18 141 lb 3.2 oz (64 kg)     GEN:  Well nourished, well developed in no acute distress HEENT: Normal NECK: No JVD; No carotid bruits LYMPHATICS: No lymphadenopathy CARDIAC: RRR, no murmurs, rubs, gallops RESPIRATORY:  Clear to auscultation without rales, wheezing or rhonchi  ABDOMEN: Soft, non-tender, non-distended MUSCULOSKELETAL:  No edema; No deformity  SKIN: Warm and dry NEUROLOGIC:  Alert and oriented x 3 PSYCHIATRIC:  Normal affect     Signed, Norman HerrlichBrian Cela Newcom, MD  07/01/2018 4:09 PM    [facility]

## 2018-07-01 ENCOUNTER — Encounter: Payer: Self-pay | Admitting: Cardiology

## 2018-07-01 ENCOUNTER — Other Ambulatory Visit (INDEPENDENT_AMBULATORY_CARE_PROVIDER_SITE_OTHER): Payer: 59

## 2018-07-01 ENCOUNTER — Ambulatory Visit: Payer: 59 | Admitting: Cardiology

## 2018-07-01 VITALS — BP 124/82 | HR 70 | Ht 66.0 in | Wt 142.1 lb

## 2018-07-01 DIAGNOSIS — E785 Hyperlipidemia, unspecified: Secondary | ICD-10-CM | POA: Diagnosis not present

## 2018-07-01 DIAGNOSIS — R002 Palpitations: Secondary | ICD-10-CM | POA: Diagnosis not present

## 2018-07-01 DIAGNOSIS — E7801 Familial hypercholesterolemia: Secondary | ICD-10-CM

## 2018-07-01 DIAGNOSIS — R079 Chest pain, unspecified: Secondary | ICD-10-CM | POA: Diagnosis not present

## 2018-07-01 MED ORDER — AMLODIPINE BESYLATE 2.5 MG PO TABS: 2.5000 mg | ORAL_TABLET | Freq: Every day | ORAL | 3 refills | Status: DC

## 2018-07-01 NOTE — Patient Instructions (Signed)
Medication Instructions:  Your physician has recommended you make the following change in your medication:   START amlodipine (norvasc) 2.5 mg: Take 1 tablet daily   If you need a refill on your cardiac medications before your next appointment, please call your pharmacy.   Lab work: Your physician recommends that you return for lab work within 3-7 days before cardiac CTA: BMP. Please return to our office for lab work, no appointment needed. You do not need to fast beforehand.   If you have labs (blood work) drawn today and your tests are completely normal, you will receive your results only by: Marland Kitchen MyChart Message (if you have MyChart) OR . A paper copy in the mail If you have any lab test that is abnormal or we need to change your treatment, we will call you to review the results.  Testing/Procedures: Your physician has requested that you have cardiac CT. Cardiac computed tomography (CT) is a painless test that uses an x-ray machine to take clear, detailed pictures of your heart. For further information please visit [email or web address] Please follow instruction sheet as given.   Please arrive at the [facility] main entrance of [facility] at xx:xx AM (30-45 minutes prior to test start time)  [facility] [address] [place], [place] [address] [phone]  Proceed to the [facility] Radiology Department (First Floor).  Please follow these instructions carefully (unless otherwise directed):   On the Night Before the Test: . Be sure to Drink plenty of water. . Do not consume any caffeinated/decaffeinated beverages or chocolate 12 hours prior to your test. . Do not take any antihistamines 12 hours prior to your test.  On the Day of the Test: . Drink plenty of water. Do not drink any water within one hour of the test. . Do not eat any food 4 hours prior to the test. . You may take your regular medications prior to the test.  . Take metoprolol (Lopressor)  two hours prior to test.       After the Test: . Drink plenty of water. . After receiving IV contrast, you may experience a mild flushed feeling. This is normal. . On occasion, you may experience a mild rash up to 24 hours after the test. This is not dangerous. If this occurs, you can take Benadryl 25 mg and increase your fluid intake. . If you experience trouble breathing, this can be serious. If it is severe call 911 IMMEDIATELY. If it is mild, please call our office.   Follow-Up: At [facility], you and your health needs are our priority.  As part of our continuing mission to provide you with exceptional heart care, we have created designated Provider Care Teams.  These Care Teams include your primary Cardiologist (physician) and Advanced Practice Providers (APPs -  Physician Assistants and Nurse Practitioners) who all work together to provide you with the care you need, when you need it. You will need a follow up appointment in 6 weeks.       Amlodipine tablets What is this medicine? AMLODIPINE (am LOE di peen) is a calcium-channel blocker. It affects the amount of calcium found in your heart and muscle cells. This relaxes your blood vessels, which can reduce the amount of work the heart has to do. This medicine is used to lower high blood pressure. It is also used to prevent chest pain. This medicine may be used for other purposes; ask your health care provider or pharmacist if you  have questions. COMMON BRAND NAME(S): Norvasc What should I tell my health care provider before I take this medicine? They need to know if you have any of these conditions: -heart disease -liver disease -an unusual or allergic reaction to amlodipine, other medicines, foods, dyes, or preservatives -pregnant or trying to get pregnant -breast-feeding How should I use this medicine? Take this medicine by mouth with a glass of water. Follow the directions on the prescription label. You can take it with or  without food. If it upsets your stomach, take it with food. Take your medicine at regular intervals. Do not take it more often than directed. Do not stop taking except on your doctor's advice. Talk to your pediatrician regarding the use of this medicine in children. While this drug may be prescribed for children as young as 6 years for selected conditions, precautions do apply. Patients over 57 years of age may have a stronger reaction and need a smaller dose. Overdosage: If you think you have taken too much of this medicine contact a poison control center or emergency room at once. NOTE: This medicine is only for you. Do not share this medicine with others. What if I miss a dose? If you miss a dose, take it as soon as you can. If it is almost time for your next dose, take only that dose. Do not take double or extra doses. What may interact with this medicine? Do not take this medicine with any of the following medications: -tranylcypromine This medicine may also interact with the following medications: -clarithromycin -cyclosporine -diltiazem -itraconazole -simvastatin -tacrolimus This list may not describe all possible interactions. Give your health care provider a list of all the medicines, herbs, non-prescription drugs, or dietary supplements you use. Also tell them if you smoke, drink alcohol, or use illegal drugs. Some items may interact with your medicine. What should I watch for while using this medicine? Visit your healthcare professional for regular checks on your progress. Check your blood pressure as directed. Ask your healthcare professional what your blood pressure should be and when you should contact him or her. Do not treat yourself for coughs, colds, or pain while you are using this medicine without asking your healthcare professional for advice. Some medicines may increase your blood pressure. You may get dizzy. Do not drive, use machinery, or do anything that needs mental  alertness until you know how this medicine affects you. Do not stand or sit up quickly, especially if you are an older patient. This reduces the risk of dizzy or fainting spells. Avoid alcoholic drinks; they can make you dizzier. What side effects may I notice from receiving this medicine? Side effects that you should report to your doctor or health care professional as soon as possible: -allergic reactions like skin rash, itching or hives; swelling of the face, lips, or tongue -fast, irregular heartbeat -signs and symptoms of low blood pressure like dizziness; feeling faint or lightheaded, falls; unusually weak or tired -swelling of ankles, feet, hands Side effects that usually do not require medical attention (report these to your doctor or health care professional if they continue or are bothersome): -dry mouth -facial flushing -headache -stomach pain -tiredness This list may not describe all possible side effects. Call your doctor for medical advice about side effects. You may report side effects to FDA at 1-800-FDA-1088. Where should I keep my medicine? Keep out of the reach of children. Store at room temperature between 59 and 86 degrees F (15 and 30 degrees  C). Throw away any unused medicine after the expiration date. NOTE: This sheet is a summary. It may not cover all possible information. If you have questions about this medicine, talk to your doctor, pharmacist, or health care provider.  2019 Elsevier/Gold Standard (2018-01-11 15:07:10)    Cardiac CT Angiogram  A cardiac CT angiogram is a procedure to look at the heart and the area around the heart. It may be done to help find the cause of chest pains or other symptoms of heart disease. During this procedure, a large X-ray machine, called a CT scanner, takes detailed pictures of the heart and the surrounding area after a dye (contrast material) has been injected into blood vessels in the area. The procedure is also sometimes  called a coronary CT angiogram, coronary artery scanning, or CTA. A cardiac CT angiogram allows the health care provider to see how well blood is flowing to and from the heart. The health care provider will be able to see if there are any problems, such as:  Blockage or narrowing of the coronary arteries in the heart.  Fluid around the heart.  Signs of weakness or disease in the muscles, valves, and tissues of the heart. Tell a health care provider about:  Any allergies you have. This is especially important if you have had a previous allergic reaction to contrast dye.  All medicines you are taking, including vitamins, herbs, eye drops, creams, and over-the-counter medicines.  Any blood disorders you have.  Any surgeries you have had.  Any medical conditions you have.  Whether you are pregnant or may be pregnant.  Any anxiety disorders, chronic pain, or other conditions you have that may increase your stress or prevent you from lying still. What are the risks? Generally, this is a safe procedure. However, problems may occur, including:  Bleeding.  Infection.  Allergic reactions to medicines or dyes.  Damage to other structures or organs.  Kidney damage from the dye or contrast that is used.  Increased risk of cancer from radiation exposure. This risk is low. Talk with your health care provider about: ? The risks and benefits of testing. ? How you can receive the lowest dose of radiation. What happens before the procedure?  Wear comfortable clothing and remove any jewelry, glasses, dentures, and hearing aids.  Follow instructions from your health care provider about eating and drinking. This may include: ? For 12 hours before the test - avoid caffeine. This includes tea, coffee, soda, energy drinks, and diet pills. Drink plenty of water or other fluids that do not have caffeine in them. Being well-hydrated can prevent complications. ? For 4-6 hours before the test - stop  eating and drinking. The contrast dye can cause nausea, but this is less likely if your stomach is empty.  Ask your health care provider about changing or stopping your regular medicines. This is especially important if you are taking diabetes medicines, blood thinners, or medicines to treat erectile dysfunction. What happens during the procedure?  Hair on your chest may need to be removed so that small sticky patches called electrodes can be placed on your chest. These will transmit information that helps to monitor your heart during the test.  An IV tube will be inserted into one of your veins.  You might be given a medicine to control your heart rate during the test. This will help to ensure that good images are obtained.  You will be asked to lie on an exam table. This table  will slide in and out of the CT machine during the procedure.  Contrast dye will be injected into the IV tube. You might feel warm, or you may get a metallic taste in your mouth.  You will be given a medicine (nitroglycerin) to relax (dilate) the arteries in your heart.  The table that you are lying on will move into the CT machine tunnel for the scan.  The person running the machine will give you instructions while the scans are being done. You may be asked to: ? Keep your arms above your head. ? Hold your breath. ? Stay very still, even if the table is moving.  When the scanning is complete, you will be moved out of the machine.  The IV tube will be removed. The procedure may vary among health care providers and hospitals. What happens after the procedure?  You might feel warm, or you may get a metallic taste in your mouth from the contrast dye.  You may have a headache from the nitroglycerin.  After the procedure, drink water or other fluids to wash (flush) the contrast material out of your body.  Contact a health care provider if you have any symptoms of allergy to the contrast. These symptoms  include: ? Shortness of breath. ? Rash or hives. ? A racing heartbeat.  Most people can return to their normal activities right after the procedure. Ask your health care provider what activities are safe for you.  It is up to you to get the results of your procedure. Ask your health care provider, or the department that is doing the procedure, when your results will be ready. Summary  A cardiac CT angiogram is a procedure to look at the heart and the area around the heart. It may be done to help find the cause of chest pains or other symptoms of heart disease.  During this procedure, a large X-ray machine, called a CT scanner, takes detailed pictures of the heart and the surrounding area after a dye (contrast material) has been injected into blood vessels in the area.  Ask your health care provider about changing or stopping your regular medicines before the procedure. This is especially important if you are taking diabetes medicines, blood thinners, or medicines to treat erectile dysfunction.  After the procedure, drink water or other fluids to wash (flush) the contrast material out of your body. This information is not intended to replace advice given to you by your health care provider. Make sure you discuss any questions you have with your health care provider. Document Released: 06/01/2008 Document Revised: 05/08/2016 Document Reviewed: 05/08/2016 Elsevier Interactive Patient Education  2019 [employer]

## 2018-07-02 LAB — COMPREHENSIVE METABOLIC PANEL
ALT: 10 U/L (ref 0–35)
AST: 13 U/L (ref 0–37)
Albumin: 4.5 g/dL (ref 3.5–5.2)
Alkaline Phosphatase: 66 U/L (ref 39–117)
BUN: 17 mg/dL (ref 6–23)
CO2: 27 mEq/L (ref 19–32)
Calcium: 10.2 mg/dL (ref 8.4–10.5)
Chloride: 105 mEq/L (ref 96–112)
Creatinine, Ser: 0.87 mg/dL (ref 0.40–1.20)
GFR: 71.26 mL/min (ref >60.00)
Glucose, Bld: 82 mg/dL (ref 70–99)
Potassium: 4.8 mEq/L (ref 3.5–5.1)
Sodium: 140 mEq/L (ref 135–145)
Total Bilirubin: 0.4 mg/dL (ref 0.2–1.2)
Total Protein: 6.9 g/dL (ref 6.0–8.3)

## 2018-07-02 LAB — LIPID PANEL
CHOLESTEROL: 226 mg/dL — AB (ref 0–200)
HDL: 60 mg/dL (ref >39.00)
LDL Cholesterol: 131 mg/dL — ABNORMAL HIGH (ref 0–99)
NonHDL: 165.56
Total CHOL/HDL Ratio: 4
Triglycerides: 172 mg/dL — ABNORMAL HIGH (ref 0.0–149.0)
VLDL: 34.4 mg/dL (ref 0.0–40.0)

## 2018-07-31 ENCOUNTER — Telehealth (HOSPITAL_COMMUNITY): Payer: Self-pay | Admitting: Emergency Medicine

## 2018-07-31 NOTE — Telephone Encounter (Signed)
Reaching out to patient to offer assistance regarding upcoming cardiac imaging study; pt verbalizes understanding of appt date/time, parking situation and where to check in, pre-test NPO status and medications ordered, and verified current allergies; name and call back number provided for further questions should they arise Dontee Jaso RN Navigator Cardiac Imaging [facility] [phone] office [phone] cell 

## 2018-08-02 ENCOUNTER — Ambulatory Visit (HOSPITAL_COMMUNITY)
Admission: RE | Admit: 2018-08-02 | Discharge: 2018-08-02 | Disposition: A | Discharge status: Home / Self Care | Payer: 59 | Source: Ambulatory Visit | Attending: Cardiology | Admitting: Cardiology

## 2018-08-02 ENCOUNTER — Ambulatory Visit (HOSPITAL_COMMUNITY): Admission: RE | Admit: 2018-08-02 | Payer: 59 | Source: Ambulatory Visit

## 2018-08-02 DIAGNOSIS — R079 Chest pain, unspecified: Secondary | ICD-10-CM

## 2018-08-02 DIAGNOSIS — Z006 Encounter for examination for normal comparison and control in clinical research program: Secondary | ICD-10-CM

## 2018-08-02 MED ORDER — NITROGLYCERIN 0.4 MG SL SUBL
0.8000 mg | SUBLINGUAL_TABLET | Freq: Once | SUBLINGUAL | Status: AC
  Administered 2018-08-02: 0.8 mg via SUBLINGUAL
  Filled 2018-08-02: qty 25

## 2018-08-02 MED ORDER — NITROGLYCERIN 0.4 MG SL SUBL
SUBLINGUAL_TABLET | SUBLINGUAL | Status: AC
  Filled 2018-08-02: qty 2

## 2018-08-02 MED ORDER — METOPROLOL TARTRATE 5 MG/5ML IV SOLN
5.0000 mg | INTRAVENOUS | Status: DC | PRN
  Administered 2018-08-02: 5 mg via INTRAVENOUS
  Filled 2018-08-02 (×2): qty 5

## 2018-08-02 MED ORDER — IOPAMIDOL (ISOVUE-370) INJECTION 76%
100.0000 mL | Freq: Once | INTRAVENOUS | Status: AC | PRN
  Administered 2018-08-02: 100 mL via INTRAVENOUS

## 2018-08-02 MED ORDER — METOPROLOL TARTRATE 5 MG/5ML IV SOLN
INTRAVENOUS | Status: AC
  Administered 2018-08-02: 5 mg via INTRAVENOUS
  Filled 2018-08-02: qty 10

## 2018-08-02 NOTE — Research (Signed)
Cadfem Informed Consent    Patient Name: Molly Snyder   Subject met inclusion and exclusion criteria.  The informed consent form, study requirements and expectations were reviewed with the subject and questions and concerns were addressed prior to the signing of the consent form.  The subject verbalized understanding of the trail requirements.  The subject agreed to participate in the CADFEM trial and signed the informed consent.  The informed consent was obtained prior to performance of any protocol-specific procedures for the subject.  A copy of the signed informed consent was given to the subject and a copy was placed in the subject's medical record.   Neva Seat

## 2018-08-06 ENCOUNTER — Telehealth: Payer: Self-pay | Admitting: Family Medicine

## 2018-08-06 MED ORDER — ATORVASTATIN CALCIUM 10 MG PO TABS: 10.0000 mg | ORAL_TABLET | Freq: Every day | ORAL | 2 refills | Status: DC

## 2018-08-06 NOTE — Telephone Encounter (Signed)
Medication sent in. 

## 2018-08-06 NOTE — Telephone Encounter (Signed)
Pt called stating is needing refill on atorvastatin (LIPITOR) 10 MG tablet sent to [id number] - [place], [place] - [address] AT [address]. Please advise. Pt tel [phone].

## 2018-08-11 NOTE — Progress Notes (Signed)
Cardiology Office Note:    Date:  08/12/2018   ID:  Molly Snyder, DOB 1961/05/27, MRN [id number]  PCP:  [name], MD  Cardiologist:  Norman Herrlich, MD    Referring MD: [name], MD    ASSESSMENT:    1. CAD in native artery   2. Familial hypercholesterolemia    PLAN:    In order of problems listed above:  1. Mild CAD from cardiac CTA I reviewed the findings and advised ongoing medical treatment she is pending a follow-up lipid profile likely require second drug either PCSK9 or Zetia in addition to statin to achieve LDLs of 50-70. 2. Baseline LDL 242 likely familial hyperlipidemia I suspect she will require second drug to chief target   Next appointment: 6 months   Medication Adjustments/Labs and Tests Ordered: Current medicines are reviewed at length with the patient today.  Concerns regarding medicines are outlined above.  No orders of the defined types were placed in this encounter.  No orders of the defined types were placed in this encounter.   Chief Complaint  Patient presents with  . Coronary Artery Disease    History of Present Illness:    [name] is a 58 y.o. female with a hx of chest pain right bundle branch block normal coronary arteriography in the 1990s last seen 07/01/2018 with angina pectoris referred for coronary CTA.  Her calcium score was in a high risk group 465 with mild CAD less than 50% proximal and distal right coronary and 50s percent proximal LAD stenosis.  Technically the study could not be referred for FFR. Compliance with diet, lifestyle and medications: Yes  He had a chance to sit down go over cardiac CTA she has mild right coronary LAD stenosis 50% or less.  Should continue medical treatment including beta-blocker aspirin lipid-lowering therapy and nitroglycerin presently is having rare episodes of nonexertional chest pain Past Medical History:  Diagnosis Date  . Allergic state 10/25/2014  . Breast lesion  09/02/2014   Left axillae   . Chest pain 09/02/2014  . Hyperlipidemia, mixed 10/25/2014  . Right bundle branch block   . Snoring 09/02/2014  . Tobacco use disorder 09/02/2014    Past Surgical History:  Procedure Laterality Date  . pylonidal cystectomy      Current Medications: Current Meds  Medication Sig  . ALPRAZolam (XANAX) 0.25 MG tablet Take 1 tablet (0.25 mg total) by mouth 2 (two) times daily as needed for anxiety.  Marland Kitchen aspirin EC 81 MG tablet Take 1 tablet (81 mg total) by mouth daily.  Marland Kitchen atorvastatin (LIPITOR) 10 MG tablet Take 1 tablet (10 mg total) by mouth daily.  Marland Kitchen loratadine (CLARITIN) 10 MG tablet Take 10 mg by mouth daily.  . metoprolol succinate (TOPROL-XL) 25 MG 24 hr tablet Take 1 tablet (25 mg total) by mouth daily.  . nitroGLYCERIN (NITROSTAT) 0.4 MG SL tablet Place 1 tablet (0.4 mg total) under the tongue every 5 (five) minutes as needed for chest pain.     Allergies:   Penicillins and Amoxicillin   Social History   Socioeconomic History  . Marital status: Single    Spouse name: Not on file  . Number of children: Not on file  . Years of education: Not on file  . Highest education level: Not on file  Occupational History  . Not on file  Social Needs  . Financial resource strain: Not on file  . Food insecurity:    Worry: Not on  file    Inability: Not on file  . Transportation needs:    Medical: Not on file    Non-medical: Not on file  Tobacco Use  . Smoking status: Current Some Day Smoker    Types: Cigarettes  . Smokeless tobacco: Never Used  Substance and Sexual Activity  . Alcohol use: Not Currently  . Drug use: No  . Sexual activity: Yes    Birth control/protection: None  Lifestyle  . Physical activity:    Days per week: Not on file    Minutes per session: Not on file  . Stress: Not on file  Relationships  . Social connections:    Talks on phone: Not on file    Gets together: Not on file    Attends religious service: Not on file    Active  member of club or organization: Not on file    Attends meetings of clubs or organizations: Not on file    Relationship status: Not on file  Other Topics Concern  . Not on file  Social History Narrative  . Not on file     Family History: The patient's family history includes Heart attack in her father, maternal uncle, and paternal grandfather; Heart disease in her father; Heart murmur in her mother; Throat cancer in her mother. ROS:   Please see the history of present illness.    All other systems reviewed and are negative.  EKGs/Labs/Other Studies Reviewed:    The following studies were reviewed today:   Recent Labs: 03/25/2018: Hemoglobin 13.8; Platelets 410.0; TSH 1.14 07/01/2018: ALT 10; BUN 17; Creatinine, Ser 0.87; Potassium 4.8; Sodium 140  Recent Lipid Panel    Component Value Date/Time   CHOL 226 (H) 07/01/2018 1635   TRIG 172.0 (H) 07/01/2018 1635   HDL 60.00 07/01/2018 1635   CHOLHDL 4 07/01/2018 1635   VLDL 34.4 07/01/2018 1635   LDLCALC 131 (H) 07/01/2018 1635   LDLDIRECT 237.0 03/25/2018 1526    Physical Exam:    VS:  BP 130/80 (BP Location: Right Arm, Patient Position: Sitting, Cuff Size: Normal)   Pulse 82   Ht 5\' 6"  (1.676 m)   Wt 145 lb (65.8 kg)   SpO2 98%   BMI 23.40 kg/m     Wt Readings from Last 3 Encounters:  08/12/18 145 lb (65.8 kg)  07/01/18 142 lb 1.9 oz (64.5 kg)  06/13/18 145 lb 12.8 oz (66.1 kg)     GEN:  Well nourished, well developed in no acute distress HEENT: Normal NECK: No JVD; No carotid bruits LYMPHATICS: No lymphadenopathy CARDIAC: RRR, no murmurs, rubs, gallops RESPIRATORY:  Clear to auscultation without rales, wheezing or rhonchi  ABDOMEN: Soft, non-tender, non-distended MUSCULOSKELETAL:  No edema; No deformity  SKIN: Warm and dry NEUROLOGIC:  Alert and oriented x 3 PSYCHIATRIC:  Normal affect    Signed, Norman Herrlich, MD  08/12/2018 3:37 PM    [facility]

## 2018-08-12 ENCOUNTER — Encounter: Payer: Self-pay | Admitting: Cardiology

## 2018-08-12 ENCOUNTER — Ambulatory Visit: Payer: 59 | Admitting: Cardiology

## 2018-08-12 VITALS — BP 130/80 | HR 82 | Ht 66.0 in | Wt 145.0 lb

## 2018-08-12 DIAGNOSIS — E7801 Familial hypercholesterolemia: Secondary | ICD-10-CM

## 2018-08-12 DIAGNOSIS — I2510 Atherosclerotic heart disease of native coronary artery without angina pectoris: Secondary | ICD-10-CM | POA: Diagnosis not present

## 2018-08-12 NOTE — Patient Instructions (Signed)
Medication Instructions:  Your physician recommends that you continue on your current medications as directed. Please refer to the Current Medication list given to you today.  If you need a refill on your cardiac medications before your next appointment, please call your pharmacy.   Lab work: None  If you have labs (blood work) drawn today and your tests are completely normal, you will receive your results only by: . MyChart Message (if you have MyChart) OR . A paper copy in the mail If you have any lab test that is abnormal or we need to change your treatment, we will call you to review the results.  Testing/Procedures: None  Follow-Up: At [facility], you and your health needs are our priority.  As part of our continuing mission to provide you with exceptional heart care, we have created designated Provider Care Teams.  These Care Teams include your primary Cardiologist (physician) and Advanced Practice Providers (APPs -  Physician Assistants and Nurse Practitioners) who all work together to provide you with the care you need, when you need it. You will need a follow up appointment in 1 years.  Please call our office 2 months in advance to schedule this appointment.   

## 2018-08-15 ENCOUNTER — Ambulatory Visit: Payer: 59 | Admitting: Family Medicine

## 2018-09-22 ENCOUNTER — Other Ambulatory Visit: Payer: Self-pay | Admitting: Family Medicine

## 2018-09-30 IMAGING — DX DG CERVICAL SPINE COMPLETE 4+V
5 series · 5 of 5 positions shown · non-contrast
Comparison: 02/27/2013

CLINICAL DATA: Left-sided neck pain, no known injury, initial
encounter

EXAM:
CERVICAL SPINE - COMPLETE 4+ VIEW

[c-spine lat]
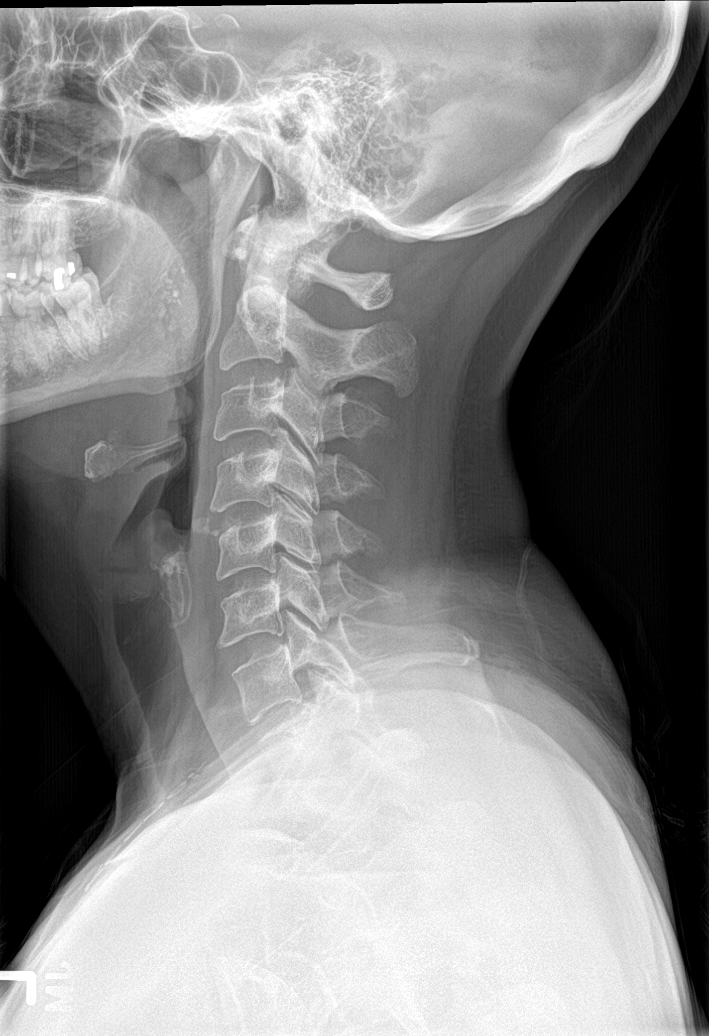

[c-spine obl (1 of 2)]
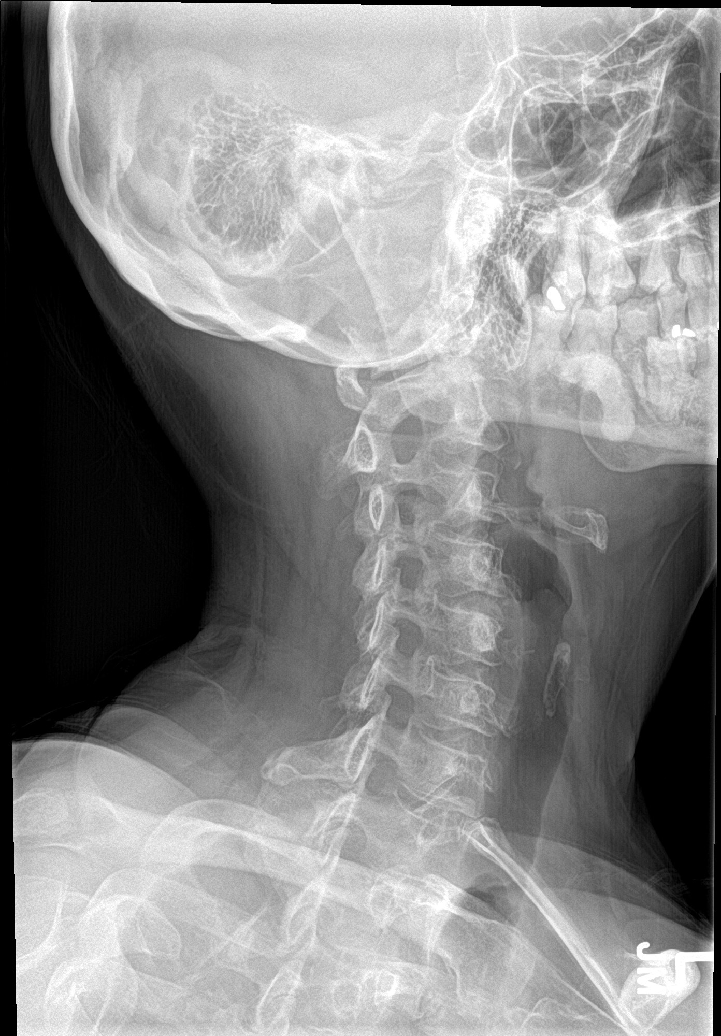

[c-spine obl (2 of 2)]
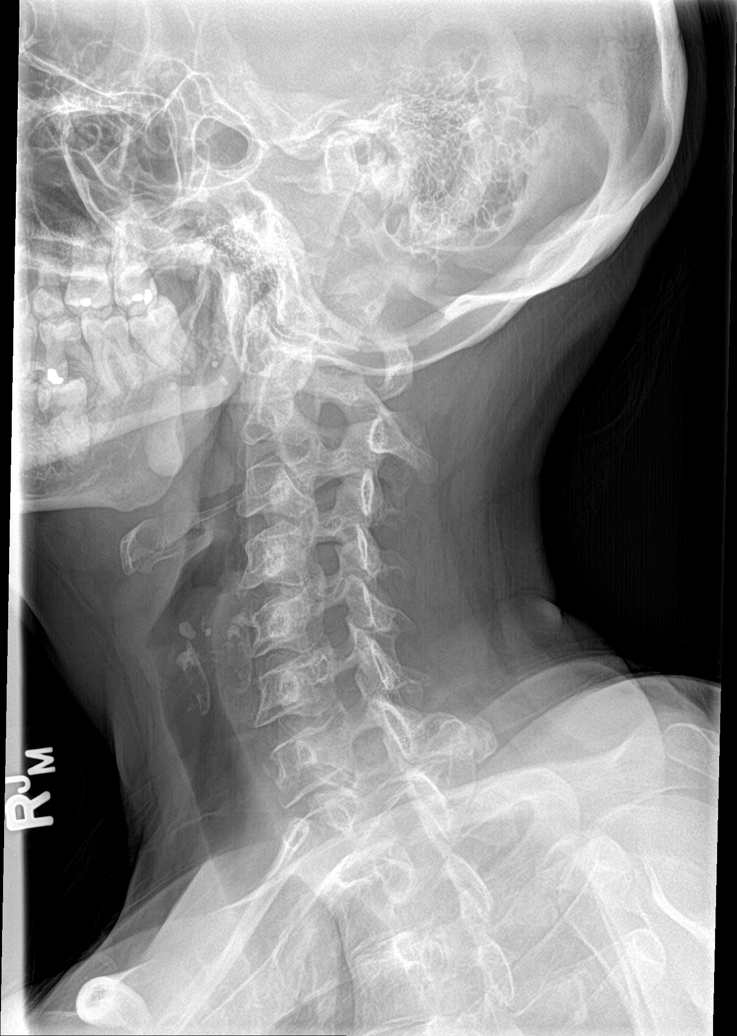

[c-spine ap]
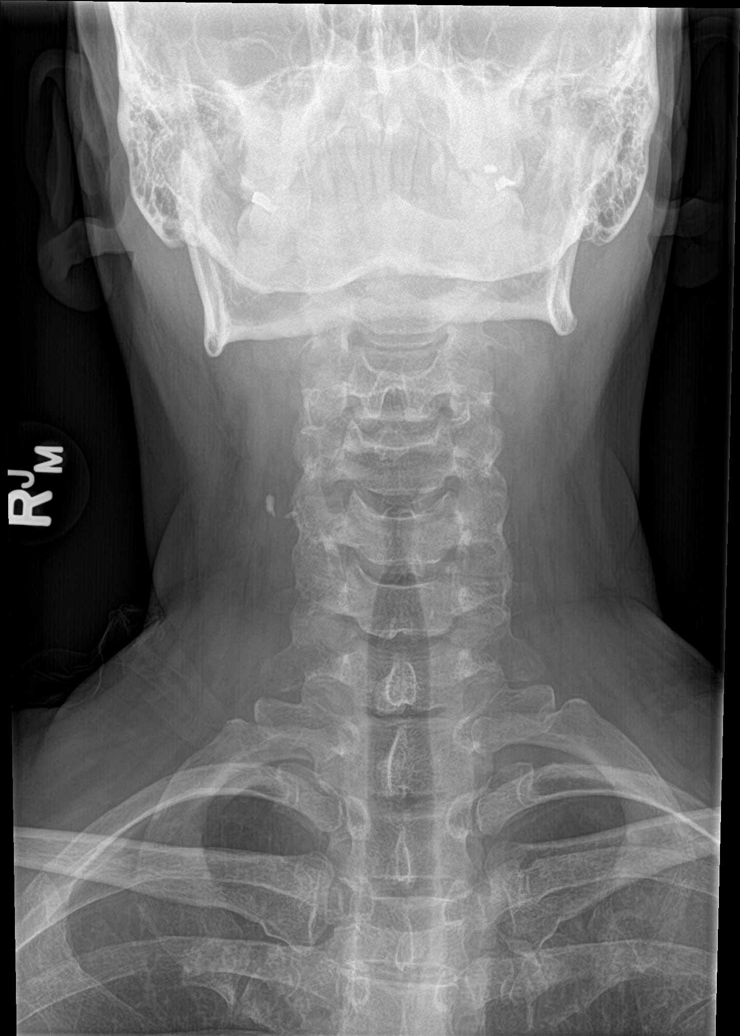

[c-spine open mouth]
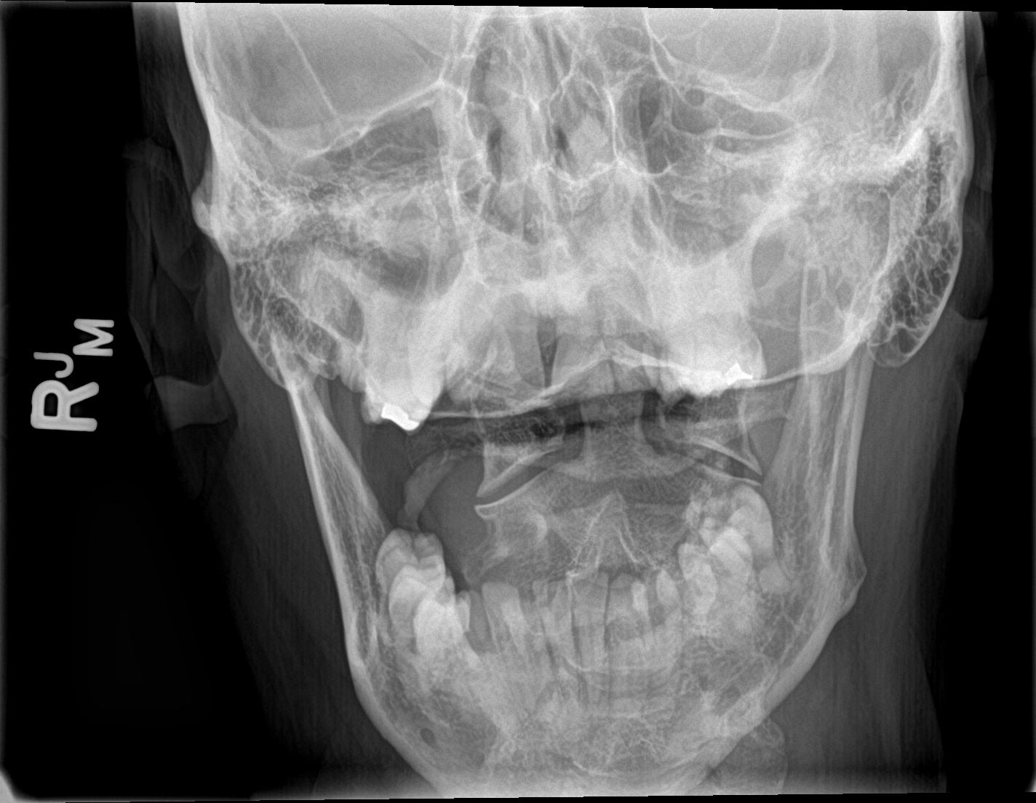

[5 of 5 positions shown; findings below may reference images not displayed]

FINDINGS: Seven cervical segments are well visualized. Vertebral body height
is well maintained. Mild osteophytic changes are seen. No
significant neural foraminal narrowing is noted. No acute fracture
or acute facet abnormality is seen. Mild right carotid
calcifications are noted. The odontoid is within normal limits.
IMPRESSION: Mild chronic changes without acute abnormality.

## 2018-12-24 ENCOUNTER — Other Ambulatory Visit: Payer: Self-pay | Admitting: Family Medicine

## 2019-01-06 ENCOUNTER — Other Ambulatory Visit: Payer: Self-pay | Admitting: Family Medicine

## 2019-01-06 ENCOUNTER — Other Ambulatory Visit: Payer: Self-pay

## 2019-01-06 ENCOUNTER — Telehealth: Payer: Self-pay | Admitting: Family Medicine

## 2019-01-06 ENCOUNTER — Ambulatory Visit (INDEPENDENT_AMBULATORY_CARE_PROVIDER_SITE_OTHER): Payer: 59 | Admitting: Family Medicine

## 2019-01-06 DIAGNOSIS — F419 Anxiety disorder, unspecified: Secondary | ICD-10-CM

## 2019-01-06 DIAGNOSIS — F329 Major depressive disorder, single episode, unspecified: Secondary | ICD-10-CM | POA: Diagnosis not present

## 2019-01-06 DIAGNOSIS — F32A Depression, unspecified: Secondary | ICD-10-CM

## 2019-01-06 MED ORDER — ALPRAZOLAM 0.25 MG PO TABS: 0.2500 mg | ORAL_TABLET | Freq: Two times a day (BID) | ORAL | 2 refills | Status: DC | PRN

## 2019-01-06 NOTE — Assessment & Plan Note (Addendum)
Her long term partner is in inpatient Hospice with less than 2 weeks to live and the partner's children have come to town. Unfortunately they are treating the patient very badly and she is concerned about how things could get she is tearful during visit.  Given refill on alprazolam which she takes sparingly. She is counseled for 20 minutes of a 25 minute visit

## 2019-01-06 NOTE — Telephone Encounter (Signed)
I have refilled and will see her

## 2019-01-06 NOTE — Telephone Encounter (Signed)
Pt came in office stating is having a lot of stress with family member of pt that being under her care Ezzard Flax- who was given only a few wk of life), pt was crying and stated is needing refill on Xanax (provider does not have appt sooner- pt is schedule with Dr Larose Kells tomorrow) Any question please call tel [phone]. Please advise ASAP.

## 2019-01-06 NOTE — Progress Notes (Addendum)
Virtual Visit via Video Note  I connected with Molly RheinCynthia A Marshman on 01/09/19 at  5:00 PM EDT by a video enabled telemedicine application and verified that I am speaking with the correct person using two identifiers.  Location: Patient: home Provider: office   I discussed the limitations of evaluation and management by telemedicine and the availability of in person appointments. The patient expressed understanding and agreed to proceed. Princess Montez Snyder CMA was able to get patient set up on video visit    Subjective:    Patient ID: Molly RheinCynthia A Crute, female    DOB: February 08, 1961, 58 y.o.   MRN: [id number]  No chief complaint on file.   HPI Patient is in today for follow up on chronic medical concerns including anxiety, depression. No polyuria or polydipsia. No recent febrile illness or hospitalizations. No polyuria, or polydipsia. Denies CP/palp/SOB/HA/congestion/fevers/GI or GU c/o. Taking meds as prescribed  Past Medical History:  Diagnosis Date  . Allergic state 10/25/2014  . Breast lesion 09/02/2014   Left axillae   . Chest pain 09/02/2014  . Hyperlipidemia, mixed 10/25/2014  . Right bundle branch block   . Snoring 09/02/2014  . Tobacco use disorder 09/02/2014    Past Surgical History:  Procedure Laterality Date  . pylonidal cystectomy      Family History  Problem Relation Age of Onset  . Throat cancer Mother   . Heart murmur Mother   . Heart disease Father   . Heart attack Father   . Heart attack Maternal Uncle   . Heart attack Paternal Grandfather     Social History   Socioeconomic History  . Marital status: Single    Spouse name: Not on file  . Number of children: Not on file  . Years of education: Not on file  . Highest education level: Not on file  Occupational History  . Not on file  Social Needs  . Financial resource strain: Not on file  . Food insecurity    Worry: Not on file    Inability: Not on file  . Transportation needs    Medical: Not on file   Non-medical: Not on file  Tobacco Use  . Smoking status: Current Some Day Smoker    Types: Cigarettes  . Smokeless tobacco: Never Used  Substance and Sexual Activity  . Alcohol use: Not Currently  . Drug use: No  . Sexual activity: Yes    Birth control/protection: None  Lifestyle  . Physical activity    Days per week: Not on file    Minutes per session: Not on file  . Stress: Not on file  Relationships  . Social Musicianconnections    Talks on phone: Not on file    Gets together: Not on file    Attends religious service: Not on file    Active member of club or organization: Not on file    Attends meetings of clubs or organizations: Not on file    Relationship status: Not on file  . Intimate partner violence    Fear of current or ex partner: Not on file    Emotionally abused: Not on file    Physically abused: Not on file    Forced sexual activity: Not on file  Other Topics Concern  . Not on file  Social History Narrative  . Not on file    Outpatient Medications Prior to Visit  Medication Sig Dispense Refill  . ALPRAZolam (XANAX) 0.25 MG tablet Take 1 tablet (0.25 mg total) by mouth 2 (  two) times daily as needed for anxiety. 30 tablet 2  . aspirin EC 81 MG tablet Take 1 tablet (81 mg total) by mouth daily.    Marland Kitchen atorvastatin (LIPITOR) 10 MG tablet TAKE 1 TABLET BY MOUTH DAILY 90 tablet 0  . loratadine (CLARITIN) 10 MG tablet Take 10 mg by mouth daily.    . metoprolol succinate (TOPROL-XL) 25 MG 24 hr tablet TAKE 1 TABLET(25 MG) BY MOUTH DAILY 90 tablet 3  . nitroGLYCERIN (NITROSTAT) 0.4 MG SL tablet Place 1 tablet (0.4 mg total) under the tongue every 5 (five) minutes as needed for chest pain. 25 tablet 3   No facility-administered medications prior to visit.     Allergies  Allergen Reactions  . Penicillins Anaphylaxis  . Amoxicillin Rash    Review of Systems  Constitutional: Negative for fever and malaise/fatigue.  HENT: Negative for congestion.   Eyes: Negative for  blurred vision.  Respiratory: Negative for shortness of breath.   Cardiovascular: Negative for chest pain, palpitations and leg swelling.  Gastrointestinal: Negative for abdominal pain, blood in stool and nausea.  Genitourinary: Negative for dysuria and frequency.  Musculoskeletal: Negative for falls.  Skin: Negative for rash.  Neurological: Negative for dizziness, loss of consciousness and headaches.  Endo/Heme/Allergies: Negative for environmental allergies.  Psychiatric/Behavioral: Negative for depression. The patient is not nervous/anxious.        Objective:    Physical Exam Constitutional:      Appearance: Normal appearance. She is not ill-appearing.  HENT:     Head: Normocephalic and atraumatic.  Pulmonary:     Effort: Pulmonary effort is normal.  Neurological:     General: No focal deficit present.     Mental Status: She is alert.     There were no vitals taken for this visit. Wt Readings from Last 3 Encounters:  08/12/18 145 lb (65.8 kg)  07/01/18 142 lb 1.9 oz (64.5 kg)  06/13/18 145 lb 12.8 oz (66.1 kg)    Diabetic Foot Exam - Simple   No data filed     Lab Results  Component Value Date   WBC 9.7 03/25/2018   HGB 13.8 03/25/2018   HCT 42.0 03/25/2018   PLT 410.0 (H) 03/25/2018   GLUCOSE 82 07/01/2018   CHOL 226 (H) 07/01/2018   TRIG 172.0 (H) 07/01/2018   HDL 60.00 07/01/2018   LDLDIRECT 237.0 03/25/2018   LDLCALC 131 (H) 07/01/2018   ALT 10 07/01/2018   AST 13 07/01/2018   NA 140 07/01/2018   K 4.8 07/01/2018   CL 105 07/01/2018   CREATININE 0.87 07/01/2018   BUN 17 07/01/2018   CO2 27 07/01/2018   TSH 1.14 03/25/2018   HGBA1C 6.0 03/25/2018    Lab Results  Component Value Date   TSH 1.14 03/25/2018   Lab Results  Component Value Date   WBC 9.7 03/25/2018   HGB 13.8 03/25/2018   HCT 42.0 03/25/2018   MCV 89.4 03/25/2018   PLT 410.0 (H) 03/25/2018   Lab Results  Component Value Date   NA 140 07/01/2018   K 4.8 07/01/2018   CO2  27 07/01/2018   GLUCOSE 82 07/01/2018   BUN 17 07/01/2018   CREATININE 0.87 07/01/2018   BILITOT 0.4 07/01/2018   ALKPHOS 66 07/01/2018   AST 13 07/01/2018   ALT 10 07/01/2018   PROT 6.9 07/01/2018   ALBUMIN 4.5 07/01/2018   CALCIUM 10.2 07/01/2018   GFR 71.26 07/01/2018   Lab Results  Component Value Date  CHOL 226 (H) 07/01/2018   Lab Results  Component Value Date   HDL 60.00 07/01/2018   Lab Results  Component Value Date   LDLCALC 131 (H) 07/01/2018   Lab Results  Component Value Date   TRIG 172.0 (H) 07/01/2018   Lab Results  Component Value Date   CHOLHDL 4 07/01/2018   Lab Results  Component Value Date   HGBA1C 6.0 03/25/2018       Assessment & Plan:   Problem List Items Addressed This Visit    Anxiety and depression    Her long term partner is in inpatient Hospice with less than 2 weeks to live and the partner's children have come to town. Unfortunately they are treating the patient very badly and she is concerned about how things could get she is tearful during visit.  Given refill on alprazolam which she takes sparingly. She is counseled for 20 minutes of a 25 minute visit          I am having Ophelia A. Guzzi maintain her loratadine, aspirin EC, nitroGLYCERIN, atorvastatin, metoprolol succinate, and ALPRAZolam.  No orders of the defined types were placed in this encounter.    I discussed the assessment and treatment plan with the patient. The patient was provided an opportunity to ask questions and all were answered. The patient agreed with the plan and demonstrated an understanding of the instructions.   The patient was advised to call back or seek an in-person evaluation if the symptoms worsen or if the condition fails to improve as anticipated.  I provided 25 minutes of -face-to-face time during this encounter.   Danise EdgeStacey Masa Lubin, MD

## 2019-01-06 NOTE — Telephone Encounter (Signed)
Spoke with patient, she has agreed to see Dr. Charlett Blake this afternoon at 5 patient agreed

## 2019-01-07 ENCOUNTER — Ambulatory Visit: Payer: 59 | Admitting: Internal Medicine

## 2019-02-26 ENCOUNTER — Other Ambulatory Visit: Payer: Self-pay | Admitting: Family Medicine

## 2019-05-17 ENCOUNTER — Other Ambulatory Visit: Payer: Self-pay | Admitting: Family Medicine

## 2019-07-21 ENCOUNTER — Other Ambulatory Visit: Payer: Self-pay | Admitting: Family Medicine

## 2019-07-22 NOTE — Telephone Encounter (Signed)
Requesting:xnanx Contract:yes UDS: n/a Last OV:01/06/19 Next OV:n/a Last Refill:01/06/19 #[id number] Database:   Please advise

## 2019-08-17 ENCOUNTER — Other Ambulatory Visit: Payer: Self-pay | Admitting: Family Medicine

## 2019-08-19 ENCOUNTER — Other Ambulatory Visit: Payer: Self-pay | Admitting: Family Medicine

## 2019-10-29 ENCOUNTER — Other Ambulatory Visit: Payer: Self-pay | Admitting: Family Medicine

## 2019-10-29 DIAGNOSIS — Z1231 Encounter for screening mammogram for malignant neoplasm of breast: Secondary | ICD-10-CM

## 2019-11-24 ENCOUNTER — Other Ambulatory Visit: Payer: Self-pay | Admitting: Family Medicine

## 2019-12-23 ENCOUNTER — Other Ambulatory Visit: Payer: Self-pay | Admitting: Family Medicine

## 2020-01-21 ENCOUNTER — Ambulatory Visit (INDEPENDENT_AMBULATORY_CARE_PROVIDER_SITE_OTHER): Payer: No Typology Code available for payment source | Admitting: Internal Medicine

## 2020-01-21 ENCOUNTER — Encounter: Payer: Self-pay | Admitting: Internal Medicine

## 2020-01-21 ENCOUNTER — Other Ambulatory Visit: Payer: Self-pay

## 2020-01-21 VITALS — BP 130/73 | HR 87 | Temp 97.7°F | Resp 18 | Ht 66.0 in | Wt 132.5 lb

## 2020-01-21 DIAGNOSIS — I10 Essential (primary) hypertension: Secondary | ICD-10-CM | POA: Diagnosis not present

## 2020-01-21 DIAGNOSIS — R55 Syncope and collapse: Secondary | ICD-10-CM | POA: Diagnosis not present

## 2020-01-21 DIAGNOSIS — I779 Disorder of arteries and arterioles, unspecified: Secondary | ICD-10-CM

## 2020-01-21 DIAGNOSIS — R634 Abnormal weight loss: Secondary | ICD-10-CM | POA: Diagnosis not present

## 2020-01-21 DIAGNOSIS — F329 Major depressive disorder, single episode, unspecified: Secondary | ICD-10-CM

## 2020-01-21 DIAGNOSIS — R030 Elevated blood-pressure reading, without diagnosis of hypertension: Secondary | ICD-10-CM

## 2020-01-21 DIAGNOSIS — F32A Depression, unspecified: Secondary | ICD-10-CM

## 2020-01-21 DIAGNOSIS — F419 Anxiety disorder, unspecified: Secondary | ICD-10-CM

## 2020-01-21 MED ORDER — AMLODIPINE BESYLATE 2.5 MG PO TABS: 2.5000 mg | ORAL_TABLET | Freq: Every day | ORAL | 1 refills | Status: DC

## 2020-01-21 MED FILL — AMLODIPINE 2.5 MG TABLET: 2.5 | 60 days supply | Qty: 60 | Fill #0

## 2020-01-21 NOTE — Progress Notes (Signed)
Pre visit review using our clinic review tool, if applicable. No additional management support is needed unless otherwise documented below in the visit note. 

## 2020-01-21 NOTE — Progress Notes (Signed)
Subjective:    Patient ID: Molly Snyder, female    DOB: [birth year], 59 y.o.   MRN: [id number]  DOS:  01/21/2020 Type of visit - description: Acute  The patient was  taking metoprolol (apparently for palpitations not hypertension) She reports that 4 weeks ago she was feeling very dizzy, sweaty, she got up to go to the bathroom to get some water and passed out, she had laceration at the right upper eyelid. She woke up, no postictal, checked her blood pressure: 79/57. Did not seek medical attention at the time. After that she stopped metoprolol completely.  It took 2 days before BPs were back to the 120s.  Since then, no further syncope she admits to mild lightheadedness/spinning very noticeable when she is lying down in bed and turn to one side or the other.  She is here concerned about her blood pressure: Although is typically ~ 117/79, 129/80 but sometimes the spikes, for instance today it was as high as 157/94.  When her BP increases she has a dull headache.  Request treatment.  Also continue with anxiety, weight loss. She lost her partner several months ago and has not bounced back emotionally.  Review of Systems Denies fever chills No chest pain, difficulty breathing or edema No nausea, vomiting, blood in the stools. No cough Again admits to anxiety and some depression but no suicidal ideas. Denies diplopia, slurred speech or motor deficits  Past Medical History:  Diagnosis Date  . Allergic state 10/25/2014  . Breast lesion 09/02/2014   Left axillae   . Chest pain 09/02/2014  . Hyperlipidemia, mixed 10/25/2014  . Right bundle branch block   . Snoring 09/02/2014  . Tobacco use disorder 09/02/2014    Past Surgical History:  Procedure Laterality Date  . pylonidal cystectomy      Allergies as of 01/21/2020      Reactions   Penicillins Anaphylaxis   Amoxicillin Rash      Medication List       Accurate as of January 21, 2020 11:59 PM. If you have any questions, ask your  nurse or doctor.        STOP taking these medications   metoprolol succinate 25 MG 24 hr tablet Commonly known as: TOPROL-XL Stopped by: Willow Ora, MD     TAKE these medications   ALPRAZolam 0.25 MG tablet Commonly known as: XANAX TAKE 1 TABLET(0.25 MG) BY MOUTH TWICE DAILY AS NEEDED FOR ANXIETY   amLODipine 2.5 MG tablet Commonly known as: NORVASC Take 1 tablet (2.5 mg total) by mouth daily. Started by: Willow Ora, MD   aspirin EC 81 MG tablet Take 1 tablet (81 mg total) by mouth daily.   atorvastatin 10 MG tablet Commonly known as: LIPITOR TAKE 1 TABLET BY MOUTH DAILY   loratadine 10 MG tablet Commonly known as: CLARITIN Take 10 mg by mouth daily.   nitroGLYCERIN 0.4 MG SL tablet Commonly known as: NITROSTAT DISSOLVE 1 TABLET UNDER THE TONGUE EVERY 5 MINUTES AS NEEDED FOR CHEST PAIN          Objective:   Physical Exam BP 130/73 (BP Location: Left Arm, Patient Position: Sitting, Cuff Size: Small)   Pulse 87   Temp 97.7 F (36.5 C) (Oral)   Resp 18   Ht 5\' 6"  (1.676 m)   Wt 132 lb 8 oz (60.1 kg)   SpO2 98%   BMI 21.39 kg/m  General:   Well developed, NAD, BMI noted. HEENT:  Normocephalic . Face symmetric,  atraumatic EOMI Neck: Normal carotid pulses. Lungs:  CTA B Normal respiratory effort, no intercostal retractions, no accessory muscle use. Heart: RRR,  no murmur.  Lower extremities: no pretibial edema bilaterally  Skin: Not pale. Not jaundice Neurologic:  alert & oriented X3.  Speech normal, gait appropriate for age and unassisted Psych--  Cognition and judgment appear intact.  Cooperative with normal attention span and concentration.  Behavior appropriate. No anxious but sad appearing, tearful during part of the office visit.    Assessment     59 year old female, PMH includes h/o TIA CAD per CTA, RBBB, anxiety depression, hyperglycemia, high cholesterol (last FLP 06/2018),  Syncope: As described above. We discussed that we could do further   work-up versus observation noting the most likely cause was hypotension in the context of taking beta-blockers for palpitations. We agreed on observation except but will order a carotid ultrasound d/t h/o carotid artery disease Carotid artery disease: Dx via ultrasound per patient, I do not see documentation in the chart.  See above Elevated BP: Patient was on beta-blocker at some point I believe due to palpitations, blood pressure decreased to the 70s, she had a syncope.  She stopped the medication.  Now ambulatory BPs range from 117/79-157/94. She would like to avoid those spikes because when BP is in the high side she has a mild HA Plan: Start amlodipine 2.5 mg daily, monitor BPs, see PCP in 3 - 4 weeks. Weight loss: Possibly related to anxiety depression, will do TSH. Anxiety depression: Due to the loss of her partner last year. Currently on xanax prn, recommend counseling, information provided.  We talk about SSRIs but the patient is not ready for that at this point RTC 4 weeks PCP   This visit occurred during the SARS-CoV-2 public health emergency.  Safety protocols were in place, including screening questions prior to the visit, additional usage of staff PPE, and extensive cleaning of exam room while observing appropriate contact time as indicated for disinfecting solutions.

## 2020-01-21 NOTE — Patient Instructions (Addendum)
Start amlodipine 2.5 mg daily  Continue monitoring your blood pressures    BP GOAL is between 110/65 and  135/85. If it is consistently higher or lower, let me know  Please consider see a counselor   GO TO THE LAB : Get the blood work     GO TO THE FRONT DESK, PLEASE SCHEDULE YOUR APPOINTMENTS Come back for for checkup in 3 to 4 weeks to see your primary doctor  We will schedule carotid ultrasound

## 2020-01-22 LAB — CBC WITH DIFFERENTIAL/PLATELET
Basophils Absolute: 0.1 10*3/uL (ref 0.0–0.1)
Basophils Relative: 1.1 % (ref 0.0–3.0)
Eosinophils Absolute: 0.3 10*3/uL (ref 0.0–0.7)
Eosinophils Relative: 4 % (ref 0.0–5.0)
HCT: 38.8 % (ref 36.0–46.0)
Hemoglobin: 13.1 g/dL (ref 12.0–15.0)
Lymphocytes Relative: 37.5 % (ref 12.0–46.0)
Lymphs Abs: 3.3 10*3/uL (ref 0.7–4.0)
MCHC: 33.8 g/dL (ref 30.0–36.0)
MCV: 89.2 fl (ref 78.0–100.0)
Monocytes Absolute: 0.6 10*3/uL (ref 0.1–1.0)
Monocytes Relative: 7.2 % (ref 3.0–12.0)
Neutro Abs: 4.4 10*3/uL (ref 1.4–7.7)
Neutrophils Relative %: 50.2 % (ref 43.0–77.0)
Platelets: 355 10*3/uL (ref 150.0–400.0)
RBC: 4.35 Mil/uL (ref 3.87–5.11)
RDW: 14.3 % (ref 11.5–15.5)
WBC: 8.7 10*3/uL (ref 4.0–10.5)

## 2020-01-22 LAB — COMPREHENSIVE METABOLIC PANEL
ALT: 9 U/L (ref 0–35)
AST: 14 U/L (ref 0–37)
Albumin: 4.1 g/dL (ref 3.5–5.2)
Alkaline Phosphatase: 52 U/L (ref 39–117)
BUN: 15 mg/dL (ref 6–23)
CO2: 26 mEq/L (ref 19–32)
Calcium: 9.4 mg/dL (ref 8.4–10.5)
Chloride: 103 mEq/L (ref 96–112)
Creatinine, Ser: 0.89 mg/dL (ref 0.40–1.20)
GFR: 64.96 mL/min (ref >60.00)
Glucose, Bld: 91 mg/dL (ref 70–99)
Potassium: 4.1 mEq/L (ref 3.5–5.1)
Sodium: 136 mEq/L (ref 135–145)
Total Bilirubin: 0.3 mg/dL (ref 0.2–1.2)
Total Protein: 6.9 g/dL (ref 6.0–8.3)

## 2020-01-22 LAB — TSH: TSH: 0.8 u[IU]/mL (ref 0.35–4.50)

## 2020-02-03 ENCOUNTER — Encounter: Payer: Self-pay | Admitting: Internal Medicine

## 2020-02-16 ENCOUNTER — Encounter (HOSPITAL_COMMUNITY): Payer: No Typology Code available for payment source

## 2020-02-18 ENCOUNTER — Other Ambulatory Visit: Payer: Self-pay | Admitting: Internal Medicine

## 2020-02-18 ENCOUNTER — Encounter: Payer: Self-pay | Admitting: Internal Medicine

## 2020-02-18 ENCOUNTER — Ambulatory Visit (INDEPENDENT_AMBULATORY_CARE_PROVIDER_SITE_OTHER): Payer: No Typology Code available for payment source | Admitting: Internal Medicine

## 2020-02-18 ENCOUNTER — Other Ambulatory Visit: Payer: Self-pay

## 2020-02-18 VITALS — BP 117/72 | HR 80 | Temp 98.2°F | Resp 16 | Ht 66.0 in | Wt 129.1 lb

## 2020-02-18 DIAGNOSIS — I10 Essential (primary) hypertension: Secondary | ICD-10-CM | POA: Diagnosis not present

## 2020-02-18 DIAGNOSIS — F32A Depression, unspecified: Secondary | ICD-10-CM

## 2020-02-18 DIAGNOSIS — F329 Major depressive disorder, single episode, unspecified: Secondary | ICD-10-CM | POA: Diagnosis not present

## 2020-02-18 DIAGNOSIS — F419 Anxiety disorder, unspecified: Secondary | ICD-10-CM | POA: Diagnosis not present

## 2020-02-18 DIAGNOSIS — R634 Abnormal weight loss: Secondary | ICD-10-CM | POA: Diagnosis not present

## 2020-02-18 MED ORDER — ATORVASTATIN CALCIUM 10 MG PO TABS: 10.0000 mg | ORAL_TABLET | Freq: Every day | ORAL | 1 refills | Status: DC

## 2020-02-18 MED ORDER — ESCITALOPRAM OXALATE 10 MG PO TABS: 10.0000 mg | ORAL_TABLET | Freq: Every day | ORAL | 1 refills | Status: DC

## 2020-02-18 MED FILL — ATORVASTATIN CALCIUM 10 MG: 10 | 90 days supply | Qty: 90 | Fill #0

## 2020-02-18 MED FILL — ESCITALOPRAM 10 MG TABLET: 10 | 30 days supply | Qty: 30 | Fill #0

## 2020-02-18 NOTE — Progress Notes (Signed)
Pre visit review using our clinic review tool, if applicable. No additional management support is needed unless otherwise documented below in the visit note. 

## 2020-02-18 NOTE — Patient Instructions (Signed)
Please start taking Lexapro 10 mg at nighttime.  Call if you have any issues with the new medication  Continue checking your blood pressures  Please see a counselor  GO TO THE FRONT DESK, PLEASE SCHEDULE YOUR APPOINTMENTS Come back for a checkup in 4 to 5 weeks

## 2020-02-18 NOTE — Progress Notes (Signed)
Subjective:    Patient ID: Molly Snyder, female    DOB: 1960-11-16, 59 y.o.   MRN: [id number]  DOS:  02/18/2020 Type of visit - description: Follow-up from previous visit Today with talk about hypertension, grieving, dizziness.  BP is now much improved when checked at home. No further syncope. Continue with dizziness mostly when she rolls over or lays down on her bed. She continue with depression, she started crying during the visit, denies suicidal ideas.   Wt Readings from Last 3 Encounters:  02/18/20 129 lb 2 oz (58.6 kg)  01/21/20 132 lb 8 oz (60.1 kg)  08/12/18 145 lb (65.8 kg)     Review of Systems Appetite is a still very poor and she is "hardly eating". Denies fever chills. No abdominal pain or blood in the stools  Past Medical History:  Diagnosis Date  . Allergic state 10/25/2014  . Breast lesion 09/02/2014   Left axillae   . Chest pain 09/02/2014  . Hyperlipidemia, mixed 10/25/2014  . Right bundle branch block   . Snoring 09/02/2014  . Tobacco use disorder 09/02/2014    Past Surgical History:  Procedure Laterality Date  . pylonidal cystectomy      Allergies as of 02/18/2020      Reactions   Penicillins Anaphylaxis   Amoxicillin Rash      Medication List       Accurate as of February 18, 2020 11:08 AM. If you have any questions, ask your nurse or doctor.        ALPRAZolam 0.25 MG tablet Commonly known as: XANAX TAKE 1 TABLET(0.25 MG) BY MOUTH TWICE DAILY AS NEEDED FOR ANXIETY   amLODipine 2.5 MG tablet Commonly known as: NORVASC Take 1 tablet (2.5 mg total) by mouth daily.   aspirin EC 81 MG tablet Take 1 tablet (81 mg total) by mouth daily.   atorvastatin 10 MG tablet Commonly known as: LIPITOR TAKE 1 TABLET BY MOUTH DAILY   loratadine 10 MG tablet Commonly known as: CLARITIN Take 10 mg by mouth daily.   nitroGLYCERIN 0.4 MG SL tablet Commonly known as: NITROSTAT DISSOLVE 1 TABLET UNDER THE TONGUE EVERY 5 MINUTES AS NEEDED FOR CHEST  PAIN          Objective:   Physical Exam BP 117/72 (BP Location: Left Arm, Patient Position: Sitting, Cuff Size: Small)   Pulse 80   Temp 98.2 F (36.8 C) (Oral)   Resp 16   Ht 5\' 6"  (1.676 m)   Wt 129 lb 2 oz (58.6 kg)   SpO2 98%   BMI 20.84 kg/m   General:   Well developed, NAD, BMI noted. HEENT:  Normocephalic . Face symmetric, atraumatic Skin: Not pale. Not jaundice Neurologic:  alert & oriented X3.  Speech normal, gait appropriate for age and unassisted Psych--  Cognition and judgment appear intact.  Cooperative with normal attention span and concentration.  Patient was obviously depressed, crying.     Assessment     59 year old female, PMH includes h/o TIA CAD per CTA, RBBB, anxiety depression, hyperglycemia, high cholesterol (last FLP 06/2018),  HTN: Started amlodipine, ambulatory BPs in the 120s, 130s.  Controlled.  No change Syncope: No further episodes but continue with dizziness mostly when she rolls over in bed.  Observation Carotid artery disease?:  See last visit, has a 07/2018 scheduled for later this week. Anxiety depression: See last visit, again loss her partner about a year ago, having a very hard time, continue to be quite emotional,  frequent crying, not eating. At this point I strongly recommend to see a counselor and offered medication. She agreed to both, Lexapro sent to the pharmacy, will call if she has any issues with it. Listening therapy provided. Covid vaccine reaction: The patient reports she had a moderna shot when she was working at [employer] and shortly after her BP increases and she felt heavy breathing. She also developed face and feet rash. Denies lip or tongue swelling. She was not treated with any medication and symptoms self resolve an hour later. Request a letter excepting him from second dose of the covid vaccination, recommend to obtain a letter from her previous employer [employer]) at the time of the incident documenting what  happened. RTC 4 to 5  weeks   This visit occurred during the SARS-CoV-2 public health emergency.  Safety protocols were in place, including screening questions prior to the visit, additional usage of staff PPE, and extensive cleaning of exam room while observing appropriate contact time as indicated for disinfecting solutions.

## 2020-02-19 DIAGNOSIS — I10 Essential (primary) hypertension: Secondary | ICD-10-CM | POA: Insufficient documentation

## 2020-02-20 ENCOUNTER — Other Ambulatory Visit: Payer: Self-pay

## 2020-02-20 ENCOUNTER — Ambulatory Visit (HOSPITAL_COMMUNITY)
Admission: RE | Admit: 2020-02-20 | Discharge: 2020-02-20 | Disposition: A | Discharge status: Home / Self Care | Payer: No Typology Code available for payment source | Source: Ambulatory Visit | Attending: Cardiovascular Disease | Admitting: Cardiovascular Disease

## 2020-02-20 DIAGNOSIS — I779 Disorder of arteries and arterioles, unspecified: Secondary | ICD-10-CM | POA: Diagnosis not present

## 2020-02-20 DIAGNOSIS — R55 Syncope and collapse: Secondary | ICD-10-CM

## 2020-03-12 ENCOUNTER — Other Ambulatory Visit: Payer: Self-pay | Admitting: Family Medicine

## 2020-03-12 MED FILL — AMLODIPINE 2.5 MG TABLET: 2.5 | 60 days supply | Qty: 60 | Fill #0

## 2020-03-15 MED FILL — ESCITALOPRAM 10 MG TABLET: 10 | 30 days supply | Qty: 30 | Fill #1

## 2020-03-23 ENCOUNTER — Encounter: Payer: Self-pay | Admitting: Internal Medicine

## 2020-03-23 ENCOUNTER — Ambulatory Visit (INDEPENDENT_AMBULATORY_CARE_PROVIDER_SITE_OTHER): Payer: No Typology Code available for payment source | Admitting: Internal Medicine

## 2020-03-23 ENCOUNTER — Other Ambulatory Visit: Payer: Self-pay | Admitting: Internal Medicine

## 2020-03-23 ENCOUNTER — Other Ambulatory Visit: Payer: Self-pay

## 2020-03-23 VITALS — BP 109/69 | HR 72 | Temp 98.1°F | Resp 16 | Ht 66.0 in | Wt 131.0 lb

## 2020-03-23 DIAGNOSIS — E7801 Familial hypercholesterolemia: Secondary | ICD-10-CM

## 2020-03-23 DIAGNOSIS — I10 Essential (primary) hypertension: Secondary | ICD-10-CM

## 2020-03-23 DIAGNOSIS — R739 Hyperglycemia, unspecified: Secondary | ICD-10-CM

## 2020-03-23 DIAGNOSIS — F329 Major depressive disorder, single episode, unspecified: Secondary | ICD-10-CM

## 2020-03-23 DIAGNOSIS — F419 Anxiety disorder, unspecified: Secondary | ICD-10-CM

## 2020-03-23 DIAGNOSIS — F32A Depression, unspecified: Secondary | ICD-10-CM

## 2020-03-23 DIAGNOSIS — R634 Abnormal weight loss: Secondary | ICD-10-CM

## 2020-03-23 MED ORDER — AMLODIPINE BESYLATE 2.5 MG PO TABS: 2.5000 mg | ORAL_TABLET | Freq: Every day | ORAL | 2 refills | Status: DC

## 2020-03-23 MED ORDER — ESCITALOPRAM OXALATE 10 MG PO TABS: 10.0000 mg | ORAL_TABLET | Freq: Every day | ORAL | 2 refills | Status: DC

## 2020-03-23 NOTE — Patient Instructions (Addendum)
Happy early [place] Ouch! I am glad you are doing better   Check the  blood pressure regularly  BP GOAL is between 110/65 and  135/85. If it is consistently higher or lower, let me know  GO TO THE LAB : Get the blood work     GO TO THE FRONT DESK, PLEASE SCHEDULE YOUR APPOINTMENTS Come back for a checkup in 4 to 5 months

## 2020-03-23 NOTE — Progress Notes (Signed)
Pre visit review using our clinic review tool, if applicable. No additional management support is needed unless otherwise documented below in the visit note. 

## 2020-03-23 NOTE — Progress Notes (Signed)
Subjective:    Patient ID: Molly Snyder, female    DOB: Nov 23, 1960, 59 y.o.   MRN: [id number]  DOS:  03/23/2020 Type of visit - description: f/u Since the last office visit she is doing better emotionally. Ambulatory BPs are checked regularly, the used to be high or low, now they are more steady Denies any dizziness  Wt Readings from Last 3 Encounters:  03/23/20 131 lb (59.4 kg)  02/18/20 129 lb 2 oz (58.6 kg)  01/21/20 132 lb 8 oz (60.1 kg)     Review of Systems See above   Past Medical History:  Diagnosis Date  . Allergic state 10/25/2014  . Breast lesion 09/02/2014   Left axillae   . Chest pain 09/02/2014  . Hyperlipidemia, mixed 10/25/2014  . Right bundle branch block   . Snoring 09/02/2014  . Tobacco use disorder 09/02/2014    Past Surgical History:  Procedure Laterality Date  . pylonidal cystectomy      Allergies as of 03/23/2020      Reactions   Penicillins Anaphylaxis   Amoxicillin Rash      Medication List       Accurate as of March 23, 2020  8:40 PM. If you have any questions, ask your nurse or doctor.        ALPRAZolam 0.25 MG tablet Commonly known as: XANAX TAKE 1 TABLET(0.25 MG) BY MOUTH TWICE DAILY AS NEEDED FOR ANXIETY   amLODipine 2.5 MG tablet Commonly known as: NORVASC Take 1 tablet (2.5 mg total) by mouth daily.   aspirin EC 81 MG tablet Take 1 tablet (81 mg total) by mouth daily.   atorvastatin 10 MG tablet Commonly known as: LIPITOR Take 1 tablet (10 mg total) by mouth daily.   escitalopram 10 MG tablet Commonly known as: Lexapro Take 1 tablet (10 mg total) by mouth daily.   loratadine 10 MG tablet Commonly known as: CLARITIN Take 10 mg by mouth daily.   nitroGLYCERIN 0.4 MG SL tablet Commonly known as: NITROSTAT DISSOLVE 1 TABLET UNDER THE TONGUE EVERY 5 MINUTES AS NEEDED FOR CHEST PAIN          Objective:   Physical Exam BP 109/69 (BP Location: Left Arm, Patient Position: Sitting, Cuff Size: Small)   Pulse 72    Temp 98.1 F (36.7 C) (Oral)   Resp 16   Ht 5\' 6"  (1.676 m)   Wt 131 lb (59.4 kg)   SpO2 98%   BMI 21.14 kg/m  General:   Well developed, NAD, BMI noted. HEENT:  Normocephalic . Face symmetric, atraumatic Skin: Not pale. Not jaundice Neurologic:  alert & oriented X3.  Speech normal, gait appropriate for age and unassisted Psych--  Cognition and judgment appear intact.  Cooperative with normal attention span and concentration.  Behavior appropriate. No anxious or depressed appearing.      Assessment     59 year old female, PMH includes h/o TIA CAD per CTA, RBBB, anxiety depression, hyperglycemia, high cholesterol (last FLP 06/2018), presents for follow-up  HTN: On a low-dose of amlodipine, BP today in the low side of normal but reports no symptoms.  Continue same medicines Carotid artery disease?  07/2018 02/2020 1 to 39% R carotid, consider recheck in 2 years Anxiety, depression: Improved, started Lexapro at the last visit,  has not seen a counselor yet.  Plan: Continue Lexapro, benefits of counseling discussed.  Reassess on RTC Weight loss: Improving. History of TIA: On aspirin, plan is to control his CV RF High cholesterol:  On Lipitor, check a FLP Hyperglycemia: Check a A1c RTC 4 to 5 months    This visit occurred during the SARS-CoV-2 public health emergency.  Safety protocols were in place, including screening questions prior to the visit, additional usage of staff PPE, and extensive cleaning of exam room while observing appropriate contact time as indicated for disinfecting solutions.

## 2020-03-24 LAB — HEMOGLOBIN A1C
Hgb A1c MFr Bld: 5.5 % of total Hgb (ref <5.7)
Mean Plasma Glucose: 111 (calc)
eAG (mmol/L): 6.2 (calc)

## 2020-03-24 LAB — LIPID PANEL
Cholesterol: 205 mg/dL — ABNORMAL HIGH (ref <200)
HDL: 56 mg/dL (ref >=50)
LDL Cholesterol (Calc): 115 mg/dL (calc) — ABNORMAL HIGH
Non-HDL Cholesterol (Calc): 149 mg/dL (calc) — ABNORMAL HIGH (ref <130)
Total CHOL/HDL Ratio: 3.7 (calc) (ref <5.0)
Triglycerides: 218 mg/dL — ABNORMAL HIGH (ref <150)

## 2020-03-26 MED ORDER — ATORVASTATIN CALCIUM 40 MG PO TABS: 40.0000 mg | ORAL_TABLET | Freq: Every day | ORAL | 1 refills | Status: DC

## 2020-03-26 MED FILL — ATORVASTATIN 40 MG TABLET: 40 | 90 days supply | Qty: 90 | Fill #0

## 2020-03-26 NOTE — Addendum Note (Signed)
Addended byConrad LaGrange D on: 03/26/2020 10:01 AM   Modules accepted: Orders

## 2020-04-20 MED FILL — ESCITALOPRAM 10 MG TABLET: 10 | 90 days supply | Qty: 90 | Fill #0

## 2020-05-24 MED FILL — ATORVASTATIN CALCIUM 10 MG: 10 | 90 days supply | Qty: 90 | Fill #1

## 2020-05-24 MED FILL — AMLODIPINE 2.5 MG TABLET: 2.5 | 60 days supply | Qty: 60 | Fill #1

## 2020-07-14 ENCOUNTER — Other Ambulatory Visit: Payer: Self-pay | Admitting: Family Medicine

## 2020-07-14 ENCOUNTER — Other Ambulatory Visit: Payer: Self-pay | Admitting: Internal Medicine

## 2020-07-14 MED FILL — ESCITALOPRAM 10 MG TABLET: 10 | 90 days supply | Qty: 90 | Fill #1

## 2020-07-14 MED FILL — AMLODIPINE 2.5 MG TABLET: 2.5 | 90 days supply | Qty: 90 | Fill #0

## 2020-07-23 ENCOUNTER — Other Ambulatory Visit: Payer: Self-pay

## 2020-07-26 ENCOUNTER — Encounter: Payer: Self-pay | Admitting: Family Medicine

## 2020-07-26 ENCOUNTER — Ambulatory Visit (INDEPENDENT_AMBULATORY_CARE_PROVIDER_SITE_OTHER): Payer: No Typology Code available for payment source | Admitting: Family Medicine

## 2020-07-26 ENCOUNTER — Other Ambulatory Visit: Payer: Self-pay

## 2020-07-26 DIAGNOSIS — F419 Anxiety disorder, unspecified: Secondary | ICD-10-CM

## 2020-07-26 DIAGNOSIS — F32A Depression, unspecified: Secondary | ICD-10-CM

## 2020-07-26 DIAGNOSIS — E7801 Familial hypercholesterolemia: Secondary | ICD-10-CM | POA: Diagnosis not present

## 2020-07-26 DIAGNOSIS — R739 Hyperglycemia, unspecified: Secondary | ICD-10-CM | POA: Diagnosis not present

## 2020-07-26 DIAGNOSIS — I10 Essential (primary) hypertension: Secondary | ICD-10-CM

## 2020-07-26 NOTE — Assessment & Plan Note (Signed)
hgba1c acceptable, minimize simple carbs. Increase exercise as tolerated.  

## 2020-07-26 NOTE — Assessment & Plan Note (Signed)
Well controlled, no changes to meds. Encouraged heart healthy diet such as the DASH diet and exercise as tolerated.  °

## 2020-07-26 NOTE — Patient Instructions (Signed)

## 2020-07-28 NOTE — Assessment & Plan Note (Addendum)
She has switched jobs and she is doing much better no change in medications.

## 2020-07-28 NOTE — Progress Notes (Signed)
Subjective:    Patient ID: Molly Snyder, female    DOB: March 15, 1961, 60 y.o.   MRN: [id number]  Chief Complaint  Patient presents with  . Follow-up    HPI Patient is in today for follow up on chronic medical concerns. No recent febrile illness or hospitalizations. No polyuria or polydipsia. 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 She has managed her grief well after loosing her long time partner. Denies CP/palp/SOB/HA/congestion/fevers/GI or GU c/o. Taking meds as prescribed  Past Medical History:  Diagnosis Date  . Allergic state 10/25/2014  . Breast lesion 09/02/2014   Left axillae   . Chest pain 09/02/2014  . Hyperlipidemia, mixed 10/25/2014  . Right bundle branch block   . Snoring 09/02/2014  . Tobacco use disorder 09/02/2014    Past Surgical History:  Procedure Laterality Date  . pylonidal cystectomy      Family History  Problem Relation Age of Onset  . Throat cancer Mother   . Heart murmur Mother   . Heart disease Father   . Heart attack Father   . Heart attack Maternal Uncle   . Heart attack Paternal Grandfather     Social History   Socioeconomic History  . Marital status: Single    Spouse name: Not on file  . Number of children: Not on file  . Years of education: Not on file  . Highest education level: Not on file  Occupational History  . Not on file  Tobacco Use  . Smoking status: Current Some Day Smoker    Types: Cigarettes  . Smokeless tobacco: Never Used  Vaping Use  . Vaping Use: Never used  Substance and Sexual Activity  . Alcohol use: Not Currently  . Drug use: No  . Sexual activity: Yes    Birth control/protection: None  Other Topics Concern  . Not on file  Social History Narrative  . Not on file   Social Determinants of Health   Financial Resource Strain: Not on file  Food Insecurity: Not on file  Transportation Needs: Not on file  Physical Activity: Not on file  Stress: Not on  file  Social Connections: Not on file  Intimate Partner Violence: Not on file    Outpatient Medications Prior to Visit  Medication Sig Dispense Refill  . ALPRAZolam (XANAX) 0.25 MG tablet TAKE 1 TABLET(0.25 MG) BY MOUTH TWICE DAILY AS NEEDED FOR ANXIETY 30 tablet 2  . amLODipine (NORVASC) 2.5 MG tablet TAKE 1 TABLET BY MOUTH ONCE DAILY 60 tablet 1  . aspirin EC 81 MG tablet Take 1 tablet (81 mg total) by mouth daily.    Marland Kitchen atorvastatin (LIPITOR) 40 MG tablet Take 1 tablet (40 mg total) by mouth at bedtime. 90 tablet 1  . escitalopram (LEXAPRO) 10 MG tablet Take 1 tablet (10 mg total) by mouth daily. 90 tablet 2  . loratadine (CLARITIN) 10 MG tablet Take 10 mg by mouth daily.    . nitroGLYCERIN (NITROSTAT) 0.4 MG SL tablet DISSOLVE 1 TABLET UNDER THE TONGUE EVERY 5 MINUTES AS NEEDED FOR CHEST PAIN 25 tablet 3   No facility-administered medications prior to visit.    Allergies  Allergen Reactions  . Penicillins Anaphylaxis  . Amoxicillin Rash    Review of Systems  Constitutional: Negative for fever and malaise/fatigue.  HENT: Negative for congestion.   Eyes: Negative for blurred vision.  Respiratory: Negative for shortness of breath.   Cardiovascular: Negative  for chest pain, palpitations and leg swelling.  Gastrointestinal: Negative for abdominal pain, blood in stool and nausea.  Genitourinary: Negative for dysuria and frequency.  Musculoskeletal: Negative for falls.  Skin: Negative for rash.  Neurological: Negative for dizziness, loss of consciousness and headaches.  Endo/Heme/Allergies: Negative for environmental allergies.  Psychiatric/Behavioral: Negative for depression. The patient is not nervous/anxious.        Objective:    Physical Exam Vitals and nursing note reviewed.  Constitutional:      General: She is not in acute distress.    Appearance: She is well-developed and well-nourished.  HENT:     Head: Normocephalic and atraumatic.     Nose: Nose normal.  Eyes:      General:        Right eye: No discharge.        Left eye: No discharge.  Cardiovascular:     Rate and Rhythm: Normal rate and regular rhythm.     Heart sounds: No murmur heard.   Pulmonary:     Effort: Pulmonary effort is normal.     Breath sounds: Normal breath sounds.  Abdominal:     General: Bowel sounds are normal.     Palpations: Abdomen is soft.     Tenderness: There is no abdominal tenderness.  Musculoskeletal:        General: No edema.     Cervical back: Normal range of motion and neck supple.  Skin:    General: Skin is warm and dry.  Neurological:     Mental Status: She is alert and oriented to person, place, and time.  Psychiatric:        Mood and Affect: Mood and affect normal.     BP 116/64   Pulse 81   Temp 98 F (36.7 C) (Oral)   Resp 16   Wt 130 lb 6.4 oz (59.1 kg)   SpO2 98%   BMI 21.05 kg/m  Wt Readings from Last 3 Encounters:  07/26/20 130 lb 6.4 oz (59.1 kg)  03/23/20 131 lb (59.4 kg)  02/18/20 129 lb 2 oz (58.6 kg)    Diabetic Foot Exam - Simple   No data filed    Lab Results  Component Value Date   WBC 8.7 01/21/2020   HGB 13.1 01/21/2020   HCT 38.8 01/21/2020   PLT 355.0 01/21/2020   GLUCOSE 91 01/21/2020   CHOL 205 (H) 03/23/2020   TRIG 218 (H) 03/23/2020   HDL 56 03/23/2020   LDLDIRECT 237.0 03/25/2018   LDLCALC 115 (H) 03/23/2020   ALT 9 01/21/2020   AST 14 01/21/2020   NA 136 01/21/2020   K 4.1 01/21/2020   CL 103 01/21/2020   CREATININE 0.89 01/21/2020   BUN 15 01/21/2020   CO2 26 01/21/2020   TSH 0.80 01/21/2020   HGBA1C 5.5 03/23/2020    Lab Results  Component Value Date   TSH 0.80 01/21/2020   Lab Results  Component Value Date   WBC 8.7 01/21/2020   HGB 13.1 01/21/2020   HCT 38.8 01/21/2020   MCV 89.2 01/21/2020   PLT 355.0 01/21/2020   Lab Results  Component Value Date   NA 136 01/21/2020   K 4.1 01/21/2020   CO2 26 01/21/2020   GLUCOSE 91 01/21/2020   BUN 15 01/21/2020   CREATININE 0.89  01/21/2020   BILITOT 0.3 01/21/2020   ALKPHOS 52 01/21/2020   AST 14 01/21/2020   ALT 9 01/21/2020   PROT 6.9 01/21/2020   ALBUMIN  4.1 01/21/2020   CALCIUM 9.4 01/21/2020   GFR 64.96 01/21/2020   Lab Results  Component Value Date   CHOL 205 (H) 03/23/2020   Lab Results  Component Value Date   HDL 56 03/23/2020   Lab Results  Component Value Date   LDLCALC 115 (H) 03/23/2020   Lab Results  Component Value Date   TRIG 218 (H) 03/23/2020   Lab Results  Component Value Date   CHOLHDL 3.7 03/23/2020   Lab Results  Component Value Date   HGBA1C 5.5 03/23/2020       Assessment & Plan:   Problem List Items Addressed This Visit    Anxiety and depression    She has switched jobs and she is doing much better       Hyperlipidemia    Tolerating statin, encouraged heart healthy diet, avoid trans fats, minimize simple carbs and saturated fats. Increase exercise as tolerated      Hyperglycemia    hgba1c acceptable, minimize simple carbs. Increase exercise as tolerated.       Hypertension    Well controlled, no changes to meds. Encouraged heart healthy diet such as the DASH diet and exercise as tolerated.          I am having Molly Snyder maintain her loratadine, aspirin EC, ALPRAZolam, nitroGLYCERIN, escitalopram, atorvastatin, and amLODipine.  No orders of the defined types were placed in this encounter.    Danise Edge, MD

## 2020-07-28 NOTE — Assessment & Plan Note (Signed)
Tolerating statin, encouraged heart healthy diet, avoid trans fats, minimize simple carbs and saturated fats. Increase exercise as tolerated 

## 2020-08-05 ENCOUNTER — Other Ambulatory Visit: Payer: Self-pay | Admitting: Family Medicine

## 2020-08-05 ENCOUNTER — Other Ambulatory Visit: Payer: Self-pay

## 2020-08-05 ENCOUNTER — Encounter: Payer: Self-pay | Admitting: Family Medicine

## 2020-08-05 MED ORDER — ALPRAZOLAM 0.25 MG PO TABS: ORAL_TABLET | ORAL | 2 refills | Status: DC

## 2020-08-05 NOTE — Progress Notes (Signed)
done

## 2020-08-05 NOTE — Progress Notes (Unsigned)
Requesting: Alprazolam  Contract: unknown UDS: unknown Last Visit: 07/26/2020 Next Visit: 02/10/2021 Last Refill: 07/21/2020

## 2020-08-06 MED FILL — ALPRAZolam 0.25 MG TABS: 0.25 | 15 days supply | Qty: 30 | Fill #0

## 2020-08-18 MED FILL — ALPRAZolam 0.25 MG TABS: 0.25 | 15 days supply | Qty: 30 | Fill #0

## 2020-09-10 ENCOUNTER — Other Ambulatory Visit: Payer: Self-pay | Admitting: Internal Medicine

## 2020-09-10 ENCOUNTER — Other Ambulatory Visit: Payer: Self-pay | Admitting: Family Medicine

## 2020-09-10 MED ORDER — ATORVASTATIN CALCIUM 40 MG PO TABS: 40.0000 mg | ORAL_TABLET | Freq: Every day | ORAL | 1 refills | Status: DC

## 2020-09-10 MED FILL — ATORVASTATIN CALCIUM 40 MG: 40 | 90 days supply | Qty: 90 | Fill #0

## 2020-10-02 ENCOUNTER — Other Ambulatory Visit (HOSPITAL_BASED_OUTPATIENT_CLINIC_OR_DEPARTMENT_OTHER): Payer: Self-pay

## 2020-10-02 MED FILL — Amlodipine Besylate Tab 2.5 MG (Base Equivalent): ORAL | 60 days supply | Qty: 60 | Fill #0 | Status: AC

## 2020-10-22 ENCOUNTER — Other Ambulatory Visit (HOSPITAL_BASED_OUTPATIENT_CLINIC_OR_DEPARTMENT_OTHER): Payer: Self-pay

## 2020-10-22 MED FILL — Escitalopram Oxalate Tab 10 MG (Base Equiv): ORAL | 90 days supply | Qty: 90 | Fill #0 | Status: AC

## 2020-12-02 ENCOUNTER — Other Ambulatory Visit (HOSPITAL_BASED_OUTPATIENT_CLINIC_OR_DEPARTMENT_OTHER): Payer: Self-pay

## 2020-12-02 MED FILL — Amlodipine Besylate Tab 2.5 MG (Base Equivalent): ORAL | 60 days supply | Qty: 60 | Fill #1 | Status: AC

## 2020-12-02 MED FILL — Atorvastatin Calcium Tab 40 MG (Base Equivalent): ORAL | 90 days supply | Qty: 90 | Fill #0 | Status: AC

## 2020-12-03 ENCOUNTER — Other Ambulatory Visit (HOSPITAL_BASED_OUTPATIENT_CLINIC_OR_DEPARTMENT_OTHER): Payer: Self-pay

## 2020-12-06 ENCOUNTER — Other Ambulatory Visit (HOSPITAL_BASED_OUTPATIENT_CLINIC_OR_DEPARTMENT_OTHER): Payer: Self-pay

## 2021-01-16 ENCOUNTER — Other Ambulatory Visit: Payer: Self-pay | Admitting: Internal Medicine

## 2021-01-17 ENCOUNTER — Other Ambulatory Visit (HOSPITAL_BASED_OUTPATIENT_CLINIC_OR_DEPARTMENT_OTHER): Payer: Self-pay

## 2021-01-17 MED ORDER — ESCITALOPRAM OXALATE 10 MG PO TABS
ORAL_TABLET | Freq: Every day | ORAL | 2 refills | Status: DC
  Filled 2021-01-17: qty 90, 90d supply, fill #0
  Filled 2021-04-18: qty 90, 90d supply, fill #1
  Filled 2021-08-08: qty 90, 90d supply, fill #2

## 2021-02-10 ENCOUNTER — Encounter: Payer: No Typology Code available for payment source | Admitting: Family Medicine

## 2021-02-27 ENCOUNTER — Other Ambulatory Visit: Payer: Self-pay | Admitting: Family Medicine

## 2021-02-27 ENCOUNTER — Other Ambulatory Visit: Payer: Self-pay | Admitting: Internal Medicine

## 2021-02-28 ENCOUNTER — Other Ambulatory Visit (HOSPITAL_BASED_OUTPATIENT_CLINIC_OR_DEPARTMENT_OTHER): Payer: Self-pay

## 2021-02-28 MED ORDER — ATORVASTATIN CALCIUM 40 MG PO TABS
ORAL_TABLET | Freq: Every day | ORAL | 0 refills | Status: DC
  Filled 2021-02-28: qty 90, 90d supply, fill #0

## 2021-02-28 MED ORDER — AMLODIPINE BESYLATE 2.5 MG PO TABS
ORAL_TABLET | Freq: Every day | ORAL | 0 refills | Status: DC
  Filled 2021-02-28: qty 30, 30d supply, fill #0

## 2021-04-01 ENCOUNTER — Other Ambulatory Visit: Payer: Self-pay | Admitting: Family Medicine

## 2021-04-04 ENCOUNTER — Other Ambulatory Visit (HOSPITAL_BASED_OUTPATIENT_CLINIC_OR_DEPARTMENT_OTHER): Payer: Self-pay

## 2021-04-04 MED ORDER — AMLODIPINE BESYLATE 2.5 MG PO TABS
ORAL_TABLET | Freq: Every day | ORAL | 0 refills | Status: DC
  Filled 2021-04-04: qty 15, 15d supply, fill #0

## 2021-04-18 ENCOUNTER — Other Ambulatory Visit: Payer: Self-pay | Admitting: Family Medicine

## 2021-04-19 ENCOUNTER — Other Ambulatory Visit (HOSPITAL_BASED_OUTPATIENT_CLINIC_OR_DEPARTMENT_OTHER): Payer: Self-pay

## 2021-04-19 MED ORDER — AMLODIPINE BESYLATE 2.5 MG PO TABS
ORAL_TABLET | Freq: Every day | ORAL | 0 refills | Status: DC
  Filled 2021-04-19: qty 30, 30d supply, fill #0

## 2021-04-19 NOTE — Telephone Encounter (Signed)
Left messages on both home and cell that she needs appointment.  Rx sent in for 30 more days.

## 2021-06-16 ENCOUNTER — Other Ambulatory Visit: Payer: Self-pay | Admitting: Family Medicine

## 2021-06-16 ENCOUNTER — Other Ambulatory Visit (HOSPITAL_BASED_OUTPATIENT_CLINIC_OR_DEPARTMENT_OTHER): Payer: Self-pay

## 2021-06-16 MED ORDER — ATORVASTATIN CALCIUM 40 MG PO TABS
ORAL_TABLET | Freq: Every day | ORAL | 0 refills | Status: DC
  Filled 2021-06-16 – 2021-06-28 (×2): qty 90, 90d supply, fill #0

## 2021-06-16 NOTE — Telephone Encounter (Signed)
Called pt to schedule f/u appt. Has not been seen since Jan. No answer so I left a message to call back.

## 2021-06-24 ENCOUNTER — Other Ambulatory Visit (HOSPITAL_BASED_OUTPATIENT_CLINIC_OR_DEPARTMENT_OTHER): Payer: Self-pay

## 2021-06-28 ENCOUNTER — Other Ambulatory Visit (HOSPITAL_BASED_OUTPATIENT_CLINIC_OR_DEPARTMENT_OTHER): Payer: Self-pay

## 2021-08-09 ENCOUNTER — Other Ambulatory Visit (HOSPITAL_BASED_OUTPATIENT_CLINIC_OR_DEPARTMENT_OTHER): Payer: Self-pay

## 2021-11-20 ENCOUNTER — Other Ambulatory Visit: Payer: Self-pay | Admitting: Internal Medicine

## 2021-11-21 ENCOUNTER — Telehealth: Payer: Self-pay | Admitting: Family Medicine

## 2021-11-21 ENCOUNTER — Other Ambulatory Visit (HOSPITAL_BASED_OUTPATIENT_CLINIC_OR_DEPARTMENT_OTHER): Payer: Self-pay

## 2021-11-21 MED ORDER — ESCITALOPRAM OXALATE 10 MG PO TABS
10.0000 mg | ORAL_TABLET | Freq: Every day | ORAL | 1 refills | Status: DC
  Filled 2021-11-21: qty 90, 90d supply, fill #0

## 2021-11-21 NOTE — Telephone Encounter (Signed)
LVM for patient to get scheduled with paz  I have Canceled the one with blyth

## 2021-11-21 NOTE — Telephone Encounter (Signed)
Okay to schedule

## 2021-11-21 NOTE — Telephone Encounter (Signed)
Patient is needing CPE before a certain time. Patient requested if paz could complete one before Nov. I told her normally the NP/PA under doc will do those. But she would like paz! I told her I would ask!  Please advise

## 2021-12-14 ENCOUNTER — Other Ambulatory Visit: Payer: Self-pay | Admitting: Family Medicine

## 2021-12-15 ENCOUNTER — Other Ambulatory Visit (HOSPITAL_BASED_OUTPATIENT_CLINIC_OR_DEPARTMENT_OTHER): Payer: Self-pay

## 2021-12-15 ENCOUNTER — Encounter (HOSPITAL_BASED_OUTPATIENT_CLINIC_OR_DEPARTMENT_OTHER): Payer: Self-pay

## 2021-12-21 ENCOUNTER — Other Ambulatory Visit (HOSPITAL_BASED_OUTPATIENT_CLINIC_OR_DEPARTMENT_OTHER): Payer: Self-pay

## 2021-12-21 ENCOUNTER — Other Ambulatory Visit: Payer: Self-pay | Admitting: Family Medicine

## 2022-01-18 ENCOUNTER — Ambulatory Visit (INDEPENDENT_AMBULATORY_CARE_PROVIDER_SITE_OTHER): Payer: 59 | Admitting: Internal Medicine

## 2022-01-18 ENCOUNTER — Other Ambulatory Visit (HOSPITAL_BASED_OUTPATIENT_CLINIC_OR_DEPARTMENT_OTHER): Payer: Self-pay

## 2022-01-18 ENCOUNTER — Encounter: Payer: Self-pay | Admitting: Internal Medicine

## 2022-01-18 VITALS — BP 136/86 | HR 72 | Temp 97.7°F | Resp 16 | Ht 66.0 in | Wt 139.5 lb

## 2022-01-18 DIAGNOSIS — Z23 Encounter for immunization: Secondary | ICD-10-CM | POA: Diagnosis not present

## 2022-01-18 DIAGNOSIS — E7801 Familial hypercholesterolemia: Secondary | ICD-10-CM

## 2022-01-18 DIAGNOSIS — F419 Anxiety disorder, unspecified: Secondary | ICD-10-CM

## 2022-01-18 DIAGNOSIS — Z01419 Encounter for gynecological examination (general) (routine) without abnormal findings: Secondary | ICD-10-CM

## 2022-01-18 DIAGNOSIS — R739 Hyperglycemia, unspecified: Secondary | ICD-10-CM | POA: Diagnosis not present

## 2022-01-18 DIAGNOSIS — Z780 Asymptomatic menopausal state: Secondary | ICD-10-CM

## 2022-01-18 DIAGNOSIS — Z79899 Other long term (current) drug therapy: Secondary | ICD-10-CM

## 2022-01-18 DIAGNOSIS — Z0000 Encounter for general adult medical examination without abnormal findings: Secondary | ICD-10-CM

## 2022-01-18 DIAGNOSIS — Z1231 Encounter for screening mammogram for malignant neoplasm of breast: Secondary | ICD-10-CM

## 2022-01-18 DIAGNOSIS — I10 Essential (primary) hypertension: Secondary | ICD-10-CM | POA: Diagnosis not present

## 2022-01-18 DIAGNOSIS — Z1211 Encounter for screening for malignant neoplasm of colon: Secondary | ICD-10-CM

## 2022-01-18 DIAGNOSIS — F32A Depression, unspecified: Secondary | ICD-10-CM

## 2022-01-18 DIAGNOSIS — Z1272 Encounter for screening for malignant neoplasm of vagina: Secondary | ICD-10-CM

## 2022-01-18 DIAGNOSIS — E78019 Familial hypercholesterolemia, unspecified: Secondary | ICD-10-CM

## 2022-01-18 MED ORDER — ATORVASTATIN CALCIUM 40 MG PO TABS
ORAL_TABLET | Freq: Every day | ORAL | 1 refills | Status: DC
  Filled 2022-01-18: qty 90, 90d supply, fill #0
  Filled 2022-04-01: qty 90, 90d supply, fill #1

## 2022-01-18 MED ORDER — ALPRAZOLAM 0.25 MG PO TABS
0.2500 mg | ORAL_TABLET | Freq: Two times a day (BID) | ORAL | 1 refills | Status: DC | PRN
  Filled 2022-01-18: qty 60, 30d supply, fill #0

## 2022-01-18 MED ORDER — AMLODIPINE BESYLATE 2.5 MG PO TABS
2.5000 mg | ORAL_TABLET | Freq: Every day | ORAL | 1 refills | Status: DC
  Filled 2022-01-18: qty 90, 90d supply, fill #0
  Filled 2022-04-01: qty 90, 90d supply, fill #1

## 2022-01-18 MED ORDER — ESCITALOPRAM OXALATE 10 MG PO TABS
10.0000 mg | ORAL_TABLET | Freq: Every day | ORAL | 1 refills | Status: DC
  Filled 2022-01-18 – 2022-02-20 (×2): qty 90, 90d supply, fill #0
  Filled 2022-06-12: qty 90, 90d supply, fill #1

## 2022-01-18 NOTE — Progress Notes (Signed)
Subjective:    Patient ID: Molly Snyder, female    DOB: [birth year], 61 y.o.   MRN: [id number]  DOS:  01/18/2022 Type of visit - description: CPX  Requested CPX. In general feels well. Has no major concerns. Does have a issue with insomnia, takes Xanax  Review of Systems  Other than above, a 14 point review of systems is negative     Past Medical History:  Diagnosis Date   Allergic state 10/25/2014   Breast lesion 09/02/2014   Left axillae    Chest pain 09/02/2014   Hyperlipidemia, mixed 10/25/2014   Right bundle branch block    Snoring 09/02/2014   Tobacco use disorder 09/02/2014    Past Surgical History:  Procedure Laterality Date   pylonidal cystectomy     Social History   Socioeconomic History   Marital status: Single    Spouse name: Not on file   Number of children: 0   Years of education: Not on file   Highest education level: Not on file  Occupational History   Occupation: IV tech at [employer]  Tobacco Use   Smoking status: Some Days    Types: Cigarettes   Smokeless tobacco: Never  Vaping Use   Vaping Use: Never used  Substance and Sexual Activity   Alcohol use: Not Currently   Drug use: No   Sexual activity: Yes    Birth control/protection: None  Other Topics Concern   Not on file  Social History Narrative   Live by herself    Social Determinants of Health   Financial Resource Strain: Not on file  Food Insecurity: Not on file  Transportation Needs: Not on file  Physical Activity: Not on file  Stress: Not on file  Social Connections: Not on file  Intimate Partner Violence: Not on file    Current Outpatient Medications  Medication Instructions   ALPRAZolam (XANAX) 0.25 mg, Oral, 2 times daily PRN   amLODipine (NORVASC) 2.5 mg, Oral, Daily   aspirin EC 81 mg, Oral, Daily   atorvastatin (LIPITOR) 40 MG tablet TAKE 1 TABLET BY MOUTH EVERY NIGHT AT BEDTIME   escitalopram (LEXAPRO) 10 mg, Oral, Daily   loratadine (CLARITIN) 10 mg, Daily    nitroGLYCERIN (NITROSTAT) 0.4 MG SL tablet DISSOLVE 1 TABLET UNDER THE TONGUE EVERY 5 MINUTES AS NEEDED FOR CHEST PAIN       Objective:   Physical Exam BP 136/86   Pulse 72   Temp 97.7 F (36.5 C) (Oral)   Resp 16   Ht 5\' 6"  (1.676 m)   Wt 139 lb 8 oz (63.3 kg)   SpO2 96%   BMI 22.52 kg/m  General: Well developed, NAD, BMI noted Neck: No  thyromegaly  HEENT:  Normocephalic . Face symmetric, atraumatic Lungs:  CTA B Normal respiratory effort, no intercostal retractions, no accessory muscle use. Heart: RRR,  no murmur.  Abdomen:  Not distended, soft, non-tender. No rebound or rigidity.   Lower extremities: no pretibial edema bilaterally  Skin: Exposed areas without rash. Not pale. Not jaundice Neurologic:  alert & oriented X3.  Speech normal, gait appropriate for age and unassisted Strength symmetric and appropriate for age.  Psych: Cognition and judgment appear intact.  Cooperative with normal attention span and concentration.  Behavior appropriate. No anxious or depressed appearing.     Assessment    61 year old female, PMH includes hyperglycemia, TIA, + FH CAD, CAD per CT, RBBB, anxiety depression, hyperglycemia, high cholesterol,  Here for CPX, chronic problems  were assessed. Hyperglycemia: Check A1c, diet and exercise discussed TIA,h/o: asymptomatic. CAD: Last visit with cardiology 2020.  On aspirin and atorvastatin, no symptoms. High cholesterol: On atorvastatin.  Checking labs Anxiety depression: On Lexapro, symptoms controlled, takes Xanax occasionally for insomnia.  UDS today.  PDMP okay, RF sent. RTC 6 to 8 months

## 2022-01-18 NOTE — Patient Instructions (Addendum)
Vaccines I recommend: COVID booster Flu shot every fall  Start taking vitamin D, 2000 units daily.  We will schedule a bone density test  We will schedule mammogram  Referral to gynecology    GO TO THE LAB : Get the blood work     GO TO THE FRONT DESK, PLEASE SCHEDULE YOUR APPOINTMENTS Come back for a checkup in 6 to 8 months     "Living will", "Health Care Power of attorney": Advanced care planning  (If you already have a living will or healthcare power of attorney, please bring the copy to be scanned in your chart.)  Advance care planning is a process that supports adults in  understanding and sharing their preferences regarding future medical care.   The patient's preferences are recorded in documents called Advance Directives.    Advanced directives are completed (and can be modified at any time) while the patient is in full mental capacity.   The documentation should be available at all times to the patient, the family and the healthcare providers.  Bring in a copy to be scanned in your chart is an excellent idea and is recommended   This legal documents direct treatment decision making and/or appoint a surrogate to make the decision if the patient is not capable to do so.    Advance directives can be documented in many types of formats,  documents have names such as:  Lliving will  Durable power of attorney for healthcare (healthcare proxy or healthcare power of attorney)  Combined directives  Physician orders for life-sustaining treatment    More information at:  [email or web address]

## 2022-01-19 ENCOUNTER — Encounter: Payer: Self-pay | Admitting: Internal Medicine

## 2022-01-19 LAB — CBC WITH DIFFERENTIAL/PLATELET
Basophils Absolute: 0.1 10*3/uL (ref 0.0–0.1)
Basophils Relative: 1 % (ref 0.0–3.0)
Eosinophils Absolute: 0.2 10*3/uL (ref 0.0–0.7)
Eosinophils Relative: 1.8 % (ref 0.0–5.0)
HCT: 41.4 % (ref 36.0–46.0)
Hemoglobin: 13.8 g/dL (ref 12.0–15.0)
Lymphocytes Relative: 26.3 % (ref 12.0–46.0)
Lymphs Abs: 2.8 10*3/uL (ref 0.7–4.0)
MCHC: 33.4 g/dL (ref 30.0–36.0)
MCV: 88.8 fl (ref 78.0–100.0)
Monocytes Absolute: 0.8 10*3/uL (ref 0.1–1.0)
Monocytes Relative: 7.1 % (ref 3.0–12.0)
Neutro Abs: 6.9 10*3/uL (ref 1.4–7.7)
Neutrophils Relative %: 63.8 % (ref 43.0–77.0)
Platelets: 378 10*3/uL (ref 150.0–400.0)
RBC: 4.66 Mil/uL (ref 3.87–5.11)
RDW: 14.4 % (ref 11.5–15.5)
WBC: 10.8 10*3/uL — ABNORMAL HIGH (ref 4.0–10.5)

## 2022-01-19 LAB — COMPREHENSIVE METABOLIC PANEL
ALT: 11 U/L (ref 0–35)
AST: 16 U/L (ref 0–37)
Albumin: 4.5 g/dL (ref 3.5–5.2)
Alkaline Phosphatase: 70 U/L (ref 39–117)
BUN: 15 mg/dL (ref 6–23)
CO2: 24 mEq/L (ref 19–32)
Calcium: 9.8 mg/dL (ref 8.4–10.5)
Chloride: 100 mEq/L (ref 96–112)
Creatinine, Ser: 0.76 mg/dL (ref 0.40–1.20)
GFR: 84.94 mL/min (ref >60.00)
Glucose, Bld: 82 mg/dL (ref 70–99)
Potassium: 4.6 mEq/L (ref 3.5–5.1)
Sodium: 135 mEq/L (ref 135–145)
Total Bilirubin: 0.4 mg/dL (ref 0.2–1.2)
Total Protein: 7.1 g/dL (ref 6.0–8.3)

## 2022-01-19 LAB — LIPID PANEL
Cholesterol: 365 mg/dL — ABNORMAL HIGH (ref 0–200)
HDL: 54.3 mg/dL (ref >39.00)
NonHDL: 311.11
Total CHOL/HDL Ratio: 7
Triglycerides: 279 mg/dL — ABNORMAL HIGH (ref 0.0–149.0)
VLDL: 55.8 mg/dL — ABNORMAL HIGH (ref 0.0–40.0)

## 2022-01-19 LAB — TSH: TSH: 0.96 u[IU]/mL (ref 0.35–5.50)

## 2022-01-19 LAB — HEMOGLOBIN A1C: Hgb A1c MFr Bld: 6 % (ref 4.6–6.5)

## 2022-01-19 LAB — VITAMIN D 25 HYDROXY (VIT D DEFICIENCY, FRACTURES): VITD: 21.65 ng/mL — ABNORMAL LOW (ref 30.00–100.00)

## 2022-01-19 LAB — LDL CHOLESTEROL, DIRECT: Direct LDL: 266 mg/dL

## 2022-01-19 NOTE — Assessment & Plan Note (Signed)
Tdap: Today Shingrix x 2:~ 2021 per pt @ [facility] COVID-vaccine: rec a booster  Flu shot q year recommended  Female care: No Pap smear years, referred to gynecology Mammogram: No recent MMG, referral sent. CCS:no recent cscope.  3 options discussed, elected Cologuard. Bones: DEXA, recommend vitamin D Labs: CMP, FLP, CBC, A1c, TSH, vitamin D Diet and exercise discussed Healthcare power of attorney discussed

## 2022-01-20 ENCOUNTER — Other Ambulatory Visit (HOSPITAL_BASED_OUTPATIENT_CLINIC_OR_DEPARTMENT_OTHER): Payer: Self-pay

## 2022-01-20 LAB — DRUG MONITORING PANEL 375977 , URINE

## 2022-01-20 LAB — DM TEMPLATE

## 2022-01-20 MED ORDER — VITAMIN D (ERGOCALCIFEROL) 1.25 MG (50000 UNIT) PO CAPS
50000.0000 [IU] | ORAL_CAPSULE | ORAL | 0 refills | Status: DC
  Filled 2022-01-20: qty 12, 84d supply, fill #0

## 2022-01-20 NOTE — Addendum Note (Signed)
Addended byConrad Raemon D on: 01/20/2022 08:22 AM   Modules accepted: Orders

## 2022-01-24 ENCOUNTER — Ambulatory Visit (HOSPITAL_BASED_OUTPATIENT_CLINIC_OR_DEPARTMENT_OTHER)
Admission: RE | Admit: 2022-01-24 | Discharge: 2022-01-24 | Disposition: A | Discharge status: Home / Self Care | Payer: 59 | Source: Ambulatory Visit | Attending: Internal Medicine | Admitting: Internal Medicine

## 2022-01-24 ENCOUNTER — Encounter (HOSPITAL_BASED_OUTPATIENT_CLINIC_OR_DEPARTMENT_OTHER): Payer: Self-pay

## 2022-01-24 DIAGNOSIS — Z1231 Encounter for screening mammogram for malignant neoplasm of breast: Secondary | ICD-10-CM | POA: Insufficient documentation

## 2022-01-24 DIAGNOSIS — Z780 Asymptomatic menopausal state: Secondary | ICD-10-CM | POA: Diagnosis present

## 2022-02-20 ENCOUNTER — Other Ambulatory Visit (HOSPITAL_BASED_OUTPATIENT_CLINIC_OR_DEPARTMENT_OTHER): Payer: Self-pay

## 2022-02-20 DIAGNOSIS — Z1211 Encounter for screening for malignant neoplasm of colon: Secondary | ICD-10-CM

## 2022-03-25 ENCOUNTER — Emergency Department (HOSPITAL_COMMUNITY)
Admission: EM | Admit: 2022-03-25 | Discharge: 2022-03-25 | Disposition: A | Discharge status: Home / Self Care | Payer: 59 | Attending: Emergency Medicine | Admitting: Emergency Medicine

## 2022-03-25 ENCOUNTER — Encounter (HOSPITAL_COMMUNITY): Payer: Self-pay

## 2022-03-25 ENCOUNTER — Other Ambulatory Visit: Payer: Self-pay

## 2022-03-25 DIAGNOSIS — Z79899 Other long term (current) drug therapy: Secondary | ICD-10-CM | POA: Diagnosis not present

## 2022-03-25 DIAGNOSIS — Z7982 Long term (current) use of aspirin: Secondary | ICD-10-CM | POA: Insufficient documentation

## 2022-03-25 DIAGNOSIS — D72829 Elevated white blood cell count, unspecified: Secondary | ICD-10-CM | POA: Diagnosis not present

## 2022-03-25 DIAGNOSIS — N3090 Cystitis, unspecified without hematuria: Secondary | ICD-10-CM

## 2022-03-25 DIAGNOSIS — R319 Hematuria, unspecified: Secondary | ICD-10-CM | POA: Diagnosis present

## 2022-03-25 DIAGNOSIS — N3091 Cystitis, unspecified with hematuria: Secondary | ICD-10-CM | POA: Diagnosis not present

## 2022-03-25 LAB — BASIC METABOLIC PANEL
Anion gap: 7 (ref 5–15)
BUN: 16 mg/dL (ref 6–20)
CO2: 24 mmol/L (ref 22–32)
Calcium: 9.7 mg/dL (ref 8.9–10.3)
Chloride: 108 mmol/L (ref 98–111)
Creatinine, Ser: 0.82 mg/dL (ref 0.44–1.00)
GFR, Estimated: >60 mL/min (ref >60)
Glucose, Bld: 103 mg/dL — ABNORMAL HIGH (ref 70–99)
Potassium: 4.7 mmol/L (ref 3.5–5.1)
Sodium: 139 mmol/L (ref 135–145)

## 2022-03-25 LAB — URINALYSIS, ROUTINE W REFLEX MICROSCOPIC
Bilirubin Urine: NEGATIVE
Glucose, UA: NEGATIVE mg/dL
Ketones, ur: NEGATIVE mg/dL
Nitrite: NEGATIVE
Protein, ur: NEGATIVE mg/dL
Specific Gravity, Urine: 1.008 (ref 1.005–1.030)
WBC, UA: >50 WBC/hpf — ABNORMAL HIGH (ref 0–5)
pH: 5 (ref 5.0–8.0)

## 2022-03-25 LAB — CBC
HCT: 43.5 % (ref 36.0–46.0)
Hemoglobin: 13.8 g/dL (ref 12.0–15.0)
MCH: 29.5 pg (ref 26.0–34.0)
MCHC: 31.7 g/dL (ref 30.0–36.0)
MCV: 92.9 fL (ref 80.0–100.0)
Platelets: 363 10*3/uL (ref 150–400)
RBC: 4.68 MIL/uL (ref 3.87–5.11)
RDW: 13.8 % (ref 11.5–15.5)
WBC: 16.1 10*3/uL — ABNORMAL HIGH (ref 4.0–10.5)
nRBC: 0 % (ref 0.0–0.2)

## 2022-03-25 MED ORDER — NITROFURANTOIN MONOHYD MACRO 100 MG PO CAPS
100.0000 mg | ORAL_CAPSULE | Freq: Once | ORAL | Status: AC
  Administered 2022-03-25: 100 mg via ORAL
  Filled 2022-03-25: qty 1

## 2022-03-25 MED ORDER — NITROFURANTOIN MONOHYD MACRO 100 MG PO CAPS
100.0000 mg | ORAL_CAPSULE | Freq: Two times a day (BID) | ORAL | 0 refills | Status: AC
  Filled 2022-03-25: qty 10, 5d supply, fill #0

## 2022-03-25 NOTE — Discharge Instructions (Addendum)
You were seen in the emergency department for your blood in your urine and your pain with urination.  Your work-up does show that you have a urinary tract infection.  You should complete the prescription of antibiotics.  You should follow-up with your primary doctor to have your symptoms rechecked.  You should return to the emergency department if you are having worsening abdominal or back pain, you have fevers, you are vomiting and cannot tolerate the antibiotics or if you have any other new or concerning symptoms.

## 2022-03-25 NOTE — ED Provider Notes (Signed)
[facility]-EMERGENCY DEPT Provider Note   CSN: [id number] Arrival date & time: 03/25/22  1039     History  Chief Complaint  Patient presents with   Hematuria    Molly Snyder is a 61 y.o. female.  Patient is a 61 year old female with a past medical history of TIA and hyperlipidemia presenting to the emergency department with hematuria.  Patient states that she woke up this morning with some mild suprapubic discomfort.  She states that she had dysuria and significant hematuria when she urinated.  She denied any blood clots.  She states that she has had multiple episodes of hematuria since this morning.  She denies any back pain, trauma or falls.  She denies any fevers or chills, nausea or vomiting.  She denies any blood thinner use.  The history is provided by the patient.  Hematuria       Home Medications Prior to Admission medications   Medication Sig Start Date End Date Taking? Authorizing Provider  nitrofurantoin, macrocrystal-monohydrate, (MACROBID) 100 MG capsule Take 1 capsule (100 mg total) by mouth 2 (two) times daily for 5 days. 03/25/22 03/30/22 Yes Timathy Newberry, Cecile Sheerer, DO  ALPRAZolam (XANAX) 0.25 MG tablet Take 1 tablet (0.25 mg total) by mouth 2 (two) times daily as needed for anxiety. 01/18/22   Wanda Plump, MD  amLODipine (NORVASC) 2.5 MG tablet Take 1 tablet (2.5 mg total) by mouth daily. 01/18/22   Wanda Plump, MD  aspirin EC 81 MG tablet Take 1 tablet (81 mg total) by mouth daily. 06/13/18   [name], MD  atorvastatin (LIPITOR) 40 MG tablet TAKE 1 TABLET BY MOUTH EVERY NIGHT AT BEDTIME 01/18/22   Wanda Plump, MD  escitalopram (LEXAPRO) 10 MG tablet Take 1 tablet (10 mg total) by mouth daily. 01/18/22   Wanda Plump, MD  loratadine (CLARITIN) 10 MG tablet Take 10 mg by mouth daily.    [provider]  nitroGLYCERIN (NITROSTAT) 0.4 MG SL tablet DISSOLVE 1 TABLET UNDER THE TONGUE EVERY 5 MINUTES AS NEEDED FOR CHEST PAIN Patient not  taking: Reported on 01/18/2022 08/19/19   [name], MD  Vitamin D, Ergocalciferol, (DRISDOL) 1.25 MG (50000 UNIT) CAPS capsule Take 1 capsule (50,000 Units total) by mouth every 7 (seven) days. For 12 weeks 01/20/22   Wanda Plump, MD      Allergies    Penicillins and Amoxicillin    Review of Systems   Review of Systems  Genitourinary:  Positive for hematuria.    Physical Exam Updated Vital Signs BP 118/68 (BP Location: Right Arm)   Pulse 64   Temp 98 F (36.7 C) (Oral)   Resp 18   Ht 5\' 6"  (1.676 m)   Wt 63 kg   SpO2 100%   BMI 22.42 kg/m  Physical Exam Vitals and nursing note reviewed.  Constitutional:      General: She is not in acute distress.    Appearance: Normal appearance.  HENT:     Head: Normocephalic and atraumatic.     Nose: Nose normal.     Mouth/Throat:     Mouth: Mucous membranes are moist.     Pharynx: Oropharynx is clear.  Eyes:     Extraocular Movements: Extraocular movements intact.     Conjunctiva/sclera: Conjunctivae normal.  Cardiovascular:     Rate and Rhythm: Normal rate and regular rhythm.     Pulses: Normal pulses.     Heart sounds: Normal heart sounds.  Pulmonary:     Effort: Pulmonary effort is normal.     Breath sounds: Normal breath sounds.  Abdominal:     General: Abdomen is flat.     Palpations: Abdomen is soft.     Tenderness: There is no abdominal tenderness. There is no right CVA tenderness or left CVA tenderness.  Musculoskeletal:        General: Normal range of motion.     Cervical back: Normal range of motion and neck supple.  Skin:    General: Skin is warm.  Neurological:     General: No focal deficit present.     Mental Status: She is alert and oriented to person, place, and time.  Psychiatric:        Mood and Affect: Mood normal.        Behavior: Behavior normal.     ED Results / Procedures / Treatments   Labs (all labs ordered are listed, but only abnormal results are displayed) Labs Reviewed  URINALYSIS,  ROUTINE W REFLEX MICROSCOPIC - Abnormal; Notable for the following components:      Result Value   APPearance CLOUDY (*)    Hgb urine dipstick LARGE (*)    Leukocytes,Ua LARGE (*)    WBC, UA >50 (*)    Bacteria, UA RARE (*)    All other components within normal limits  BASIC METABOLIC PANEL - Abnormal; Notable for the following components:   Glucose, Bld 103 (*)    All other components within normal limits  CBC - Abnormal; Notable for the following components:   WBC 16.1 (*)    All other components within normal limits  URINE CULTURE    EKG None  Radiology No results found.  Procedures Procedures    Medications Ordered in ED Medications  nitrofurantoin (macrocrystal-monohydrate) (MACROBID) capsule 100 mg (has no administration in time range)    ED Course/ Medical Decision Making/ A&P Clinical Course as of 03/25/22 1527  Sat Mar 25, 2022  1524 Urine consistent with UTI with leukocytes and bacteria in the setting of her dysuria and suprapubic pain. She will be treated with macrobid and given PCP f/u [VK]    Clinical Course User Index [VK] Phoebe Sharps, DO                           Medical Decision Making This patient presents to the ED with chief complaint(s) of hematuria with pertinent past medical history of TIA, hypertension hyperlipidemia which further complicates the presenting complaint. The complaint involves an extensive differential diagnosis and also carries with it a high risk of complications and morbidity.    The differential diagnosis includes UTI, pyelonephritis unlikely as she has no fever or CVA tenderness, low concern for nephrolithiasis as she is well-appearing on exam and has had no radiating pain, reported history of trauma making traumatic injury unlikely  Additional history obtained: Additional history obtained from N/A Records reviewed Primary Care Documents  ED Course and Reassessment: Patient was initially evaluated by provider in  triage and had labs performed and a urine ordered.  Patient's labs are within normal range.  Urine is pending at this time.  Patient is declining any pain or nausea medication at this time.  Based on her urine results will determine if she needs any further work-up.  Independent labs interpretation:  The following labs were independently interpreted: Leukocytosis, UA consistent with UTI  Independent visualization of imaging: N/A  Consultation: - Consulted or  discussed management/test interpretation w/ external professional: N/A  Consideration for admission or further workup: Patient is stable for discharge home with outpatient treatment for UTI and was given strict return precautions Social Determinants of health: N/A    Amount and/or Complexity of Data Reviewed Labs: ordered.  Risk Prescription drug management.          Final Clinical Impression(s) / ED Diagnoses Final diagnoses:  Cystitis  Hematuria, unspecified type    Rx / DC Orders ED Discharge Orders          Ordered    nitrofurantoin, macrocrystal-monohydrate, (MACROBID) 100 MG capsule  2 times daily        03/25/22 1525              [place], Caballo K, DO 03/25/22 1527

## 2022-03-25 NOTE — ED Triage Notes (Signed)
Pt reports hematuria upon waking this am with Lower abd discomfort and urinary frequency.

## 2022-03-27 ENCOUNTER — Other Ambulatory Visit (HOSPITAL_BASED_OUTPATIENT_CLINIC_OR_DEPARTMENT_OTHER): Payer: Self-pay

## 2022-03-27 LAB — URINE CULTURE: Culture: 20000 — AB

## 2022-04-03 ENCOUNTER — Other Ambulatory Visit (HOSPITAL_BASED_OUTPATIENT_CLINIC_OR_DEPARTMENT_OTHER): Payer: Self-pay

## 2022-05-30 ENCOUNTER — Encounter: Payer: No Typology Code available for payment source | Admitting: Family Medicine

## 2022-08-03 ENCOUNTER — Other Ambulatory Visit: Payer: Self-pay | Admitting: Internal Medicine

## 2022-08-03 ENCOUNTER — Other Ambulatory Visit (HOSPITAL_BASED_OUTPATIENT_CLINIC_OR_DEPARTMENT_OTHER): Payer: Self-pay

## 2022-08-03 MED ORDER — AMLODIPINE BESYLATE 2.5 MG PO TABS
2.5000 mg | ORAL_TABLET | Freq: Every day | ORAL | 1 refills | Status: DC
  Filled 2022-08-03: qty 90, 90d supply, fill #0
  Filled 2022-12-28: qty 90, 90d supply, fill #1

## 2022-08-03 MED ORDER — ATORVASTATIN CALCIUM 40 MG PO TABS
40.0000 mg | ORAL_TABLET | Freq: Every day | ORAL | 1 refills | Status: DC
  Filled 2022-08-03: qty 90, 90d supply, fill #0
  Filled 2022-12-28: qty 90, 90d supply, fill #1

## 2022-08-18 ENCOUNTER — Encounter: Payer: Self-pay | Admitting: Family Medicine

## 2022-08-20 NOTE — Assessment & Plan Note (Addendum)
Needs complete cessation to decrease risk factors but unable thus far she continues to try and minimize use.

## 2022-08-20 NOTE — Assessment & Plan Note (Signed)
hgba1c acceptable, minimize simple carbs. Increase exercise as tolerated.  

## 2022-08-20 NOTE — Assessment & Plan Note (Signed)
Well controlled, no changes to meds. Encouraged heart healthy diet such as the DASH diet and exercise as tolerated.  

## 2022-08-20 NOTE — Assessment & Plan Note (Signed)
Tolerating statin, encouraged heart healthy diet, avoid trans fats, minimize simple carbs and saturated fats. Increase exercise as tolerated 

## 2022-08-21 ENCOUNTER — Ambulatory Visit (HOSPITAL_BASED_OUTPATIENT_CLINIC_OR_DEPARTMENT_OTHER)
Admission: RE | Admit: 2022-08-21 | Discharge: 2022-08-21 | Disposition: A | Discharge status: Home / Self Care | Payer: Commercial Managed Care - PPO | Source: Ambulatory Visit | Attending: Family Medicine | Admitting: Family Medicine

## 2022-08-21 ENCOUNTER — Other Ambulatory Visit: Payer: Self-pay | Admitting: Family Medicine

## 2022-08-21 ENCOUNTER — Ambulatory Visit: Payer: Commercial Managed Care - PPO | Admitting: Family Medicine

## 2022-08-21 VITALS — BP 122/64 | HR 76 | Temp 97.5°F | Resp 16 | Ht 66.0 in | Wt 143.8 lb

## 2022-08-21 DIAGNOSIS — F32A Depression, unspecified: Secondary | ICD-10-CM | POA: Diagnosis not present

## 2022-08-21 DIAGNOSIS — E559 Vitamin D deficiency, unspecified: Secondary | ICD-10-CM

## 2022-08-21 DIAGNOSIS — Z79899 Other long term (current) drug therapy: Secondary | ICD-10-CM

## 2022-08-21 DIAGNOSIS — I10 Essential (primary) hypertension: Secondary | ICD-10-CM

## 2022-08-21 DIAGNOSIS — E7801 Familial hypercholesterolemia: Secondary | ICD-10-CM | POA: Diagnosis not present

## 2022-08-21 DIAGNOSIS — M545 Low back pain, unspecified: Secondary | ICD-10-CM | POA: Diagnosis not present

## 2022-08-21 DIAGNOSIS — M25551 Pain in right hip: Secondary | ICD-10-CM

## 2022-08-21 DIAGNOSIS — R739 Hyperglycemia, unspecified: Secondary | ICD-10-CM | POA: Diagnosis not present

## 2022-08-21 DIAGNOSIS — F419 Anxiety disorder, unspecified: Secondary | ICD-10-CM

## 2022-08-21 DIAGNOSIS — F17200 Nicotine dependence, unspecified, uncomplicated: Secondary | ICD-10-CM

## 2022-08-21 LAB — COMPREHENSIVE METABOLIC PANEL
ALT: 14 U/L (ref 0–35)
AST: 15 U/L (ref 0–37)
Albumin: 4 g/dL (ref 3.5–5.2)
Alkaline Phosphatase: 63 U/L (ref 39–117)
BUN: 16 mg/dL (ref 6–23)
CO2: 24 mEq/L (ref 19–32)
Calcium: 9.4 mg/dL (ref 8.4–10.5)
Chloride: 105 mEq/L (ref 96–112)
Creatinine, Ser: 0.72 mg/dL (ref 0.40–1.20)
GFR: 90.26 mL/min (ref >60.00)
Glucose, Bld: 96 mg/dL (ref 70–99)
Potassium: 4.4 mEq/L (ref 3.5–5.1)
Sodium: 137 mEq/L (ref 135–145)
Total Bilirubin: 0.5 mg/dL (ref 0.2–1.2)
Total Protein: 6.4 g/dL (ref 6.0–8.3)

## 2022-08-21 LAB — CBC WITH DIFFERENTIAL/PLATELET
Basophils Absolute: 0.1 10*3/uL (ref 0.0–0.1)
Basophils Relative: 1 % (ref 0.0–3.0)
Eosinophils Absolute: 0.2 10*3/uL (ref 0.0–0.7)
Eosinophils Relative: 2.8 % (ref 0.0–5.0)
HCT: 42.5 % (ref 36.0–46.0)
Hemoglobin: 14.1 g/dL (ref 12.0–15.0)
Lymphocytes Relative: 29.4 % (ref 12.0–46.0)
Lymphs Abs: 2.4 10*3/uL (ref 0.7–4.0)
MCHC: 33.2 g/dL (ref 30.0–36.0)
MCV: 90.1 fl (ref 78.0–100.0)
Monocytes Absolute: 0.6 10*3/uL (ref 0.1–1.0)
Monocytes Relative: 7.5 % (ref 3.0–12.0)
Neutro Abs: 4.8 10*3/uL (ref 1.4–7.7)
Neutrophils Relative %: 59.3 % (ref 43.0–77.0)
Platelets: 363 10*3/uL (ref 150.0–400.0)
RBC: 4.71 Mil/uL (ref 3.87–5.11)
RDW: 14.1 % (ref 11.5–15.5)
WBC: 8.1 10*3/uL (ref 4.0–10.5)

## 2022-08-21 LAB — LIPID PANEL
Cholesterol: 217 mg/dL — ABNORMAL HIGH (ref 0–200)
HDL: 60.7 mg/dL (ref >39.00)
LDL Cholesterol: 126 mg/dL — ABNORMAL HIGH (ref 0–99)
NonHDL: 156.68
Total CHOL/HDL Ratio: 4
Triglycerides: 153 mg/dL — ABNORMAL HIGH (ref 0.0–149.0)
VLDL: 30.6 mg/dL (ref 0.0–40.0)

## 2022-08-21 LAB — HEMOGLOBIN A1C: Hgb A1c MFr Bld: 6 % (ref 4.6–6.5)

## 2022-08-21 LAB — VITAMIN D 25 HYDROXY (VIT D DEFICIENCY, FRACTURES): VITD: 20.94 ng/mL — ABNORMAL LOW (ref 30.00–100.00)

## 2022-08-21 LAB — TSH: TSH: 1.07 u[IU]/mL (ref 0.35–5.50)

## 2022-08-21 NOTE — Patient Instructions (Addendum)
Bone density shows osteopenia, which is thinner than normal but not as bad as osteoporosis. Recommend calcium intake of 1200 to 1500 mg daily, divided into roughly 3 doses. Best source is the diet and a single dairy serving is about 500 mg, a supplement of calcium citrate once or twice daily to balance diet is fine if not getting enough in diet. Also need Vitamin D 2000 IU caps, 1 cap daily if not already taking vitamin D. Also recommend weight baring exercise on hips and upper body to keep bones strong  Tobacco Use Disorder Tobacco use disorder (TUD) occurs when a person craves, seeks, and uses tobacco, regardless of the consequences. This disorder can cause problems with mental and physical health. It can affect your ability to have healthy relationships. It can also keep you from meeting your responsibilities at work, home, or school. Tobacco products contain a dangerous chemical called nicotine. Nicotine triggers hormones that make the body feel stimulated and works on areas of the brain that make a person feel good. These effects can make the person depend on nicotine, which makes it hard to quit tobacco. Tobacco may be: Smoked as a cigarette or cigar. Inhaled using vaping devices, such as e-cigarettes. Smoked in a pipe or hookah. Chewed as smokeless tobacco. Inhaled into the nostrils as snuff. What are the causes? This condition is caused by using nicotine. Nicotine causes changes in your brain that make you want more and more. This is called addiction. This can make it hard to stop using tobacco once you start. What are the signs or symptoms? Symptoms of TUD may include: Being unable to stop your tobacco use even after planned quit attempts. Spending an abnormal amount of time getting or using tobacco. Craving tobacco. Tobacco use that: Interferes with your work, school, or home life. Interferes with your personal and social relationships. Makes you give up activities that you once enjoyed  or found important. Using tobacco even though you know that it is: Dangerous or bad for your health or someone else's health. Causing problems in your life. Needing more and more of the substance to get the same effect (developing tolerance). Using the substance to avoid unpleasant symptoms if you do not use the substance (withdrawal). How is this diagnosed? This condition may be diagnosed based on: Your current and past tobacco use. Your health care provider may ask questions about how your tobacco use affects your life. A physical exam. You may be diagnosed with TUD if you have at least two symptoms within a 98-monthperiod. How is this treated? This condition is treated by stopping tobacco use. Many people are unable to quit on their own and need help. Treatment may include: Nicotine replacement therapy (NRT). NRT provides nicotine without the other harmful chemicals in tobacco. NRT gradually lowers the dosage of nicotine in the body and reduces withdrawal symptoms. NRT is available as: Over-the-counter gums, lozenges, and skin patches. Prescription mouth inhalers and nasal sprays. Medicine that acts on the brain to reduce cravings and withdrawal symptoms. A type of talk therapy that examines your triggers for tobacco use, how to avoid them, and how to cope with cravings (behavioral therapy). Hypnosis. This may help with withdrawal symptoms. Joining a support group for people coping with TUD. The best treatment for TUD is usually a combination of medicine, talk therapy, and support groups. Recovery can be a long process. Many people start using tobacco again after stopping (relapse). If you relapse, it does not mean that treatment will  not work. Follow these instructions at home:  Lifestyle Do not use any products that contain nicotine or tobacco. These products include cigarettes, chewing tobacco, and vaping devices, such as e-cigarettes. If you need help quitting, ask your health care  provider. Avoid things that trigger tobacco use as much as you can. Triggers include people and situations that usually cause you to use tobacco. Avoid drinks that contain caffeine, including coffee. These may worsen some withdrawal symptoms. Find ways to manage stress. Wanting to smoke may cause stress, and stress can make you want to smoke. Relaxation techniques such as deep breathing, meditation, and yoga may help. Attend support groups as needed. These groups are an important part of long-term recovery for many people. General instructions Take over-the-counter and prescription medicines only as told by your health care provider. Check with your health care provider before taking any new prescription or over-the-counter medicines. Decide on a friend, family member, or smoking quit-line (such as 1-800-QUIT-NOW in the [place] that you can call or text when you feel the urge to smoke or when you need help coping with cravings. Keep all follow-up visits. This is important. Contact a health care provider if: You are not able to take your medicines as told by your health care provider. Your symptoms get worse, even with treatment. Summary Tobacco use disorder (TUD) occurs when a person craves, seeks, and uses tobacco regardless of the consequences. This condition may be diagnosed based on your current and past tobacco use and a physical exam. Many people are unable to quit on their own and need help. Recovery can be a long process. The most effective treatment for TUD is usually a combination of medicine, talk therapy, and support groups. This information is not intended to replace advice given to you by your health care provider. Make sure you discuss any questions you have with your health care provider. Document Revised: 06/14/2021 Document Reviewed: 06/14/2021 Elsevier Patient Education  [employer].

## 2022-08-24 LAB — DRUG MONITORING PANEL 376104, URINE
Amphetamines: NEGATIVE ng/mL (ref <500)
Barbiturates: NEGATIVE ng/mL (ref <300)
Benzodiazepines: NEGATIVE ng/mL (ref <100)
Cocaine Metabolite: NEGATIVE ng/mL (ref <150)
Desmethyltramadol: NEGATIVE ng/mL (ref <100)
Opiates: NEGATIVE ng/mL (ref <100)
Oxycodone: NEGATIVE ng/mL (ref <100)
Tramadol: NEGATIVE ng/mL (ref <100)

## 2022-08-24 LAB — DM TEMPLATE

## 2022-08-27 DIAGNOSIS — M25551 Pain in right hip: Secondary | ICD-10-CM | POA: Insufficient documentation

## 2022-08-27 DIAGNOSIS — E559 Vitamin D deficiency, unspecified: Secondary | ICD-10-CM | POA: Insufficient documentation

## 2022-08-27 DIAGNOSIS — M545 Low back pain, unspecified: Secondary | ICD-10-CM | POA: Insufficient documentation

## 2022-08-27 NOTE — Assessment & Plan Note (Signed)
Xray unremarkable today will need further imaging if persists.

## 2022-08-27 NOTE — Assessment & Plan Note (Signed)
Encouraged moist heat and gentle stretching as tolerated. May try NSAIDs and prescription meds as directed and report if symptoms worsen or seek immediate care 

## 2022-08-27 NOTE — Progress Notes (Signed)
Subjective:    Patient ID: Molly Snyder, female    DOB: 10-Jun-1961, 62 y.o.   MRN: [id number]  Chief Complaint  Patient presents with   Follow-up    Follow Up    HPI Patient is in today for follow up on chronic medical concerns. No recent febrile illness or acute hospitalizations. She is noting some recent trouble with a urinary tract infection that caused hematuria. That has been treated and feels better but she has noted some right hip pain and low back pain intermittent since then. No fevers and chills. Denies CP/palp/SOB/HA/congestion/fevers/GI c/o. Taking meds as prescribed  Past Medical History:  Diagnosis Date   Allergic state 10/25/2014   Breast lesion 09/02/2014   Left axillae    Chest pain 09/02/2014   Hyperlipidemia, mixed 10/25/2014   Right bundle branch block    Snoring 09/02/2014   Tobacco use disorder 09/02/2014    Past Surgical History:  Procedure Laterality Date   pylonidal cystectomy      Family History  Problem Relation Age of Onset   Throat cancer Mother    Heart murmur Mother    Heart disease Father    Heart attack Father 33   Heart attack Paternal Grandfather    Heart attack Maternal Uncle    Colon cancer Neg Hx    Breast cancer Neg Hx     Social History   Socioeconomic History   Marital status: Single    Spouse name: Not on file   Number of children: 0   Years of education: Not on file   Highest education level: Not on file  Occupational History   Occupation: IV tech at [employer] Use   Smoking status: Some Days    Types: Cigarettes   Smokeless tobacco: Never  Vaping Use   Vaping Use: Never used  Substance and Sexual Activity   Alcohol use: Not Currently   Drug use: No   Sexual activity: Yes    Birth control/protection: None  Other Topics Concern   Not on file  Social History Narrative   Live by herself    Social Determinants of Health   Financial Resource Strain: Not on file  Food Insecurity: Not on file   Transportation Needs: Not on file  Physical Activity: Not on file  Stress: Not on file  Social Connections: Not on file  Intimate Partner Violence: Not on file    Outpatient Medications Prior to Visit  Medication Sig Dispense Refill   ALPRAZolam (XANAX) 0.25 MG tablet Take 1 tablet (0.25 mg total) by mouth 2 (two) times daily as needed for anxiety. 60 tablet 1   amLODipine (NORVASC) 2.5 MG tablet Take 1 tablet (2.5 mg total) by mouth daily. 90 tablet 1   aspirin EC 81 MG tablet Take 1 tablet (81 mg total) by mouth daily.     atorvastatin (LIPITOR) 40 MG tablet Take 1 tablet (40 mg total) by mouth at bedtime. 90 tablet 1   escitalopram (LEXAPRO) 10 MG tablet Take 1 tablet (10 mg total) by mouth daily. 90 tablet 1   loratadine (CLARITIN) 10 MG tablet Take 10 mg by mouth daily.     nitroGLYCERIN (NITROSTAT) 0.4 MG SL tablet DISSOLVE 1 TABLET UNDER THE TONGUE EVERY 5 MINUTES AS NEEDED FOR CHEST PAIN 25 tablet 3   Vitamin D, Ergocalciferol, (DRISDOL) 1.25 MG (50000 UNIT) CAPS capsule Take 1 capsule (50,000 Units total) by mouth every 7 (seven) days. For 12 weeks 12 capsule 0  No facility-administered medications prior to visit.    Allergies  Allergen Reactions   Penicillins Anaphylaxis   Amoxicillin Rash    Review of Systems  Constitutional:  Negative for fever and malaise/fatigue.  HENT:  Negative for congestion.   Eyes:  Negative for blurred vision.  Respiratory:  Negative for shortness of breath.   Cardiovascular:  Negative for chest pain, palpitations and leg swelling.  Gastrointestinal:  Positive for abdominal pain. Negative for blood in stool and nausea.  Genitourinary:  Positive for hematuria. Negative for dysuria, flank pain and frequency.  Musculoskeletal:  Positive for back pain and joint pain. Negative for falls.  Skin:  Negative for rash.  Neurological:  Negative for dizziness, loss of consciousness and headaches.  Endo/Heme/Allergies:  Negative for environmental  allergies.  Psychiatric/Behavioral:  Negative for depression. The patient is not nervous/anxious.        Objective:    Physical Exam Constitutional:      General: She is not in acute distress.    Appearance: Normal appearance. She is well-developed. She is not toxic-appearing.  HENT:     Head: Normocephalic and atraumatic.     Right Ear: External ear normal.     Left Ear: External ear normal.     Nose: Nose normal.  Eyes:     General:        Right eye: No discharge.        Left eye: No discharge.     Conjunctiva/sclera: Conjunctivae normal.  Neck:     Thyroid: No thyromegaly.  Cardiovascular:     Rate and Rhythm: Normal rate and regular rhythm.     Heart sounds: Normal heart sounds. No murmur heard. Pulmonary:     Effort: Pulmonary effort is normal. No respiratory distress.     Breath sounds: Normal breath sounds.  Abdominal:     General: Bowel sounds are normal.     Palpations: Abdomen is soft.     Tenderness: There is no abdominal tenderness. There is no guarding.  Musculoskeletal:        General: Normal range of motion.     Cervical back: Neck supple.  Lymphadenopathy:     Cervical: No cervical adenopathy.  Skin:    General: Skin is warm and dry.  Neurological:     Mental Status: She is alert and oriented to person, place, and time.  Psychiatric:        Mood and Affect: Mood normal.        Behavior: Behavior normal.        Thought Content: Thought content normal.        Judgment: Judgment normal.     BP 122/64 (BP Location: Right Arm, Patient Position: Sitting, Cuff Size: Normal)   Pulse 76   Temp (!) 97.5 F (36.4 C) (Oral)   Resp 16   Ht '5\' 6"'$  (1.676 m)   Wt 143 lb 12.8 oz (65.2 kg)   SpO2 97%   BMI 23.21 kg/m  Wt Readings from Last 3 Encounters:  08/21/22 143 lb 12.8 oz (65.2 kg)  03/25/22 138 lb 14.2 oz (63 kg)  01/18/22 139 lb 8 oz (63.3 kg)    Diabetic Foot Exam - Simple   No data filed    Lab Results  Component Value Date   WBC 8.1  08/21/2022   HGB 14.1 08/21/2022   HCT 42.5 08/21/2022   PLT 363.0 08/21/2022   GLUCOSE 96 08/21/2022   CHOL 217 (H) 08/21/2022   TRIG 153.0 (H)  08/21/2022   HDL 60.70 08/21/2022   LDLDIRECT 266.0 01/18/2022   LDLCALC 126 (H) 08/21/2022   ALT 14 08/21/2022   AST 15 08/21/2022   NA 137 08/21/2022   K 4.4 08/21/2022   CL 105 08/21/2022   CREATININE 0.72 08/21/2022   BUN 16 08/21/2022   CO2 24 08/21/2022   TSH 1.07 08/21/2022   HGBA1C 6.0 08/21/2022    Lab Results  Component Value Date   TSH 1.07 08/21/2022   Lab Results  Component Value Date   WBC 8.1 08/21/2022   HGB 14.1 08/21/2022   HCT 42.5 08/21/2022   MCV 90.1 08/21/2022   PLT 363.0 08/21/2022   Lab Results  Component Value Date   NA 137 08/21/2022   K 4.4 08/21/2022   CO2 24 08/21/2022   GLUCOSE 96 08/21/2022   BUN 16 08/21/2022   CREATININE 0.72 08/21/2022   BILITOT 0.5 08/21/2022   ALKPHOS 63 08/21/2022   AST 15 08/21/2022   ALT 14 08/21/2022   PROT 6.4 08/21/2022   ALBUMIN 4.0 08/21/2022   CALCIUM 9.4 08/21/2022   ANIONGAP 7 03/25/2022   GFR 90.26 08/21/2022   Lab Results  Component Value Date   CHOL 217 (H) 08/21/2022   Lab Results  Component Value Date   HDL 60.70 08/21/2022   Lab Results  Component Value Date   LDLCALC 126 (H) 08/21/2022   Lab Results  Component Value Date   TRIG 153.0 (H) 08/21/2022   Lab Results  Component Value Date   CHOLHDL 4 08/21/2022   Lab Results  Component Value Date   HGBA1C 6.0 08/21/2022       Assessment & Plan:  Hyperglycemia Assessment & Plan: hgba1c acceptable, minimize simple carbs. Increase exercise as tolerated.   Orders: -     Hemoglobin A1c -     Comprehensive metabolic panel  Familial hypercholesterolemia Assessment & Plan: Tolerating statin, encouraged heart healthy diet, avoid trans fats, minimize simple carbs and saturated fats. Increase exercise as tolerated  Orders: -     Lipid panel  Primary hypertension Assessment &  Plan: Well controlled, no changes to meds. Encouraged heart healthy diet such as the DASH diet and exercise as tolerated.   Orders: -     CBC with Differential/Platelet -     TSH  Tobacco use disorder Assessment & Plan: Needs complete cessation to decrease risk factors but unable thus far she continues to try and minimize use.    High risk medication use -     Drug Monitoring Panel [phone] , Urine  Vitamin D deficiency -     VITAMIN D 25 Hydroxy (Vit-D Deficiency, Fractures)  Right hip pain  Low back pain, unspecified back pain laterality, unspecified chronicity, unspecified whether sciatica present  Anxiety and depression Assessment & Plan: Alprazolam prn is continued UDS updated.    Other orders -     DM TEMPLATE    Penni Homans, MD

## 2022-08-27 NOTE — Assessment & Plan Note (Signed)
Alprazolam prn is continued UDS updated.

## 2022-10-19 ENCOUNTER — Other Ambulatory Visit (HOSPITAL_BASED_OUTPATIENT_CLINIC_OR_DEPARTMENT_OTHER): Payer: Self-pay

## 2022-10-19 ENCOUNTER — Other Ambulatory Visit: Payer: Self-pay | Admitting: Internal Medicine

## 2022-10-19 MED ORDER — ESCITALOPRAM OXALATE 10 MG PO TABS
10.0000 mg | ORAL_TABLET | Freq: Every day | ORAL | 0 refills | Status: DC
  Filled 2022-10-19: qty 90, 90d supply, fill #0

## 2022-11-29 DIAGNOSIS — Z124 Encounter for screening for malignant neoplasm of cervix: Secondary | ICD-10-CM | POA: Insufficient documentation

## 2022-11-29 NOTE — Assessment & Plan Note (Signed)
hgba1c acceptable, minimize simple carbs. Increase exercise as tolerated.  

## 2022-11-29 NOTE — Assessment & Plan Note (Signed)
Tolerating statin, encouraged heart healthy diet, avoid trans fats, minimize simple carbs and saturated fats. Increase exercise as tolerated 

## 2022-11-29 NOTE — Assessment & Plan Note (Addendum)
Encouraged complete cessation. Important to quit as relates to risk of numerous cancers, cardiac and pulmonary disease as well as neurologic complications. Less than 1 ppd

## 2022-11-29 NOTE — Assessment & Plan Note (Signed)
Supplement and monitor 

## 2022-11-29 NOTE — Assessment & Plan Note (Signed)
Pap today, no concerns on exam.  

## 2022-11-29 NOTE — Assessment & Plan Note (Signed)
Well controlled, no changes to meds. Encouraged heart healthy diet such as the DASH diet and exercise as tolerated.  °

## 2022-11-30 ENCOUNTER — Ambulatory Visit: Payer: Commercial Managed Care - PPO | Admitting: Family Medicine

## 2022-11-30 VITALS — BP 116/72 | HR 75 | Temp 97.8°F | Resp 16 | Ht 64.0 in | Wt 145.0 lb

## 2022-11-30 DIAGNOSIS — E78019 Familial hypercholesterolemia, unspecified: Secondary | ICD-10-CM

## 2022-11-30 DIAGNOSIS — R739 Hyperglycemia, unspecified: Secondary | ICD-10-CM | POA: Diagnosis not present

## 2022-11-30 DIAGNOSIS — R1011 Right upper quadrant pain: Secondary | ICD-10-CM | POA: Insufficient documentation

## 2022-11-30 DIAGNOSIS — I10 Essential (primary) hypertension: Secondary | ICD-10-CM | POA: Diagnosis not present

## 2022-11-30 DIAGNOSIS — E559 Vitamin D deficiency, unspecified: Secondary | ICD-10-CM | POA: Diagnosis not present

## 2022-11-30 DIAGNOSIS — Z124 Encounter for screening for malignant neoplasm of cervix: Secondary | ICD-10-CM

## 2022-11-30 DIAGNOSIS — M25551 Pain in right hip: Secondary | ICD-10-CM | POA: Diagnosis not present

## 2022-11-30 DIAGNOSIS — F17200 Nicotine dependence, unspecified, uncomplicated: Secondary | ICD-10-CM

## 2022-11-30 DIAGNOSIS — E7801 Familial hypercholesterolemia: Secondary | ICD-10-CM

## 2022-11-30 LAB — CBC WITH DIFFERENTIAL/PLATELET
Basophils Absolute: 0.1 10*3/uL (ref 0.0–0.1)
Basophils Relative: 0.9 % (ref 0.0–3.0)
Eosinophils Absolute: 0.4 10*3/uL (ref 0.0–0.7)
Eosinophils Relative: 3.6 % (ref 0.0–5.0)
HCT: 44 % (ref 36.0–46.0)
Hemoglobin: 14.2 g/dL (ref 12.0–15.0)
Lymphocytes Relative: 26.6 % (ref 12.0–46.0)
Lymphs Abs: 2.6 10*3/uL (ref 0.7–4.0)
MCHC: 32.3 g/dL (ref 30.0–36.0)
MCV: 91.8 fl (ref 78.0–100.0)
Monocytes Absolute: 0.8 10*3/uL (ref 0.1–1.0)
Monocytes Relative: 7.9 % (ref 3.0–12.0)
Neutro Abs: 6.1 10*3/uL (ref 1.4–7.7)
Neutrophils Relative %: 61 % (ref 43.0–77.0)
Platelets: 414 10*3/uL — ABNORMAL HIGH (ref 150.0–400.0)
RBC: 4.79 Mil/uL (ref 3.87–5.11)
RDW: 14.5 % (ref 11.5–15.5)
WBC: 10 10*3/uL (ref 4.0–10.5)

## 2022-11-30 LAB — COMPREHENSIVE METABOLIC PANEL
ALT: 14 U/L (ref 0–35)
AST: 17 U/L (ref 0–37)
Albumin: 4.4 g/dL (ref 3.5–5.2)
Alkaline Phosphatase: 82 U/L (ref 39–117)
BUN: 17 mg/dL (ref 6–23)
CO2: 25 mEq/L (ref 19–32)
Calcium: 10 mg/dL (ref 8.4–10.5)
Chloride: 104 mEq/L (ref 96–112)
Creatinine, Ser: 0.74 mg/dL (ref 0.40–1.20)
GFR: 87.17 mL/min (ref >60.00)
Glucose, Bld: 86 mg/dL (ref 70–99)
Potassium: 5.5 mEq/L — ABNORMAL HIGH (ref 3.5–5.1)
Sodium: 139 mEq/L (ref 135–145)
Total Bilirubin: 0.3 mg/dL (ref 0.2–1.2)
Total Protein: 7.3 g/dL (ref 6.0–8.3)

## 2022-11-30 LAB — HEMOGLOBIN A1C: Hgb A1c MFr Bld: 6 % (ref 4.6–6.5)

## 2022-11-30 LAB — LIPID PANEL
Cholesterol: 207 mg/dL — ABNORMAL HIGH (ref 0–200)
HDL: 59.9 mg/dL (ref >39.00)
NonHDL: 147.59
Total CHOL/HDL Ratio: 3
Triglycerides: 353 mg/dL — ABNORMAL HIGH (ref 0.0–149.0)
VLDL: 70.6 mg/dL — ABNORMAL HIGH (ref 0.0–40.0)

## 2022-11-30 LAB — TSH: TSH: 0.94 u[IU]/mL (ref 0.35–5.50)

## 2022-11-30 LAB — LDL CHOLESTEROL, DIRECT: Direct LDL: 98 mg/dL

## 2022-11-30 LAB — VITAMIN D 25 HYDROXY (VIT D DEFICIENCY, FRACTURES): VITD: 23.91 ng/mL — ABNORMAL LOW (ref 30.00–100.00)

## 2022-11-30 NOTE — Patient Instructions (Signed)
Hypertension, Adult High blood pressure (hypertension) is when the force of blood pumping through the arteries is too strong. The arteries are the blood vessels that carry blood from the heart throughout the body. Hypertension forces the heart to work harder to pump blood and may cause arteries to become narrow or stiff. Untreated or uncontrolled hypertension can lead to a heart attack, heart failure, a stroke, kidney disease, and other problems. A blood pressure reading consists of a higher number over a lower number. Ideally, your blood pressure should be below 120/80. The first ("top") number is called the systolic pressure. It is a measure of the pressure in your arteries as your heart beats. The second ("bottom") number is called the diastolic pressure. It is a measure of the pressure in your arteries as the heart relaxes. What are the causes? The exact cause of this condition is not known. There are some conditions that result in high blood pressure. What increases the risk? Certain factors may make you more likely to develop high blood pressure. Some of these risk factors are under your control, including: Smoking. Not getting enough exercise or physical activity. Being overweight. Having too much fat, sugar, calories, or salt (sodium) in your diet. Drinking too much alcohol. Other risk factors include: Having a personal history of heart disease, diabetes, high cholesterol, or kidney disease. Stress. Having a family history of high blood pressure and high cholesterol. Having obstructive sleep apnea. Age. The risk increases with age. What are the signs or symptoms? High blood pressure may not cause symptoms. Very high blood pressure (hypertensive crisis) may cause: Headache. Fast or irregular heartbeats (palpitations). Shortness of breath. Nosebleed. Nausea and vomiting. Vision changes. Severe chest pain, dizziness, and seizures. How is this diagnosed? This condition is diagnosed by  measuring your blood pressure while you are seated, with your arm resting on a flat surface, your legs uncrossed, and your feet flat on the floor. The cuff of the blood pressure monitor will be placed directly against the skin of your upper arm at the level of your heart. Blood pressure should be measured at least twice using the same arm. Certain conditions can cause a difference in blood pressure between your right and left arms. If you have a high blood pressure reading during one visit or you have normal blood pressure with other risk factors, you may be asked to: Return on a different day to have your blood pressure checked again. Monitor your blood pressure at home for 1 week or longer. If you are diagnosed with hypertension, you may have other blood or imaging tests to help your health care provider understand your overall risk for other conditions. How is this treated? This condition is treated by making healthy lifestyle changes, such as eating healthy foods, exercising more, and reducing your alcohol intake. You may be referred for counseling on a healthy diet and physical activity. Your health care provider may prescribe medicine if lifestyle changes are not enough to get your blood pressure under control and if: Your systolic blood pressure is above 130. Your diastolic blood pressure is above 80. Your personal target blood pressure may vary depending on your medical conditions, your age, and other factors. Follow these instructions at home: Eating and drinking  Eat a diet that is high in fiber and potassium, and low in sodium, added sugar, and fat. An example of this eating plan is called the DASH diet. DASH stands for Dietary Approaches to Stop Hypertension. To eat this way: Eat   plenty of fresh fruits and vegetables. Try to fill one half of your plate at each meal with fruits and vegetables. Eat whole grains, such as whole-wheat pasta, brown rice, or whole-grain bread. Fill about one  fourth of your plate with whole grains. Eat or drink low-fat dairy products, such as skim milk or low-fat yogurt. Avoid fatty cuts of meat, processed or cured meats, and poultry with skin. Fill about one fourth of your plate with lean proteins, such as fish, chicken without skin, beans, eggs, or tofu. Avoid pre-made and processed foods. These tend to be higher in sodium, added sugar, and fat. Reduce your daily sodium intake. Many people with hypertension should eat less than 1,500 mg of sodium a day. Do not drink alcohol if: Your health care provider tells you not to drink. You are pregnant, may be pregnant, or are planning to become pregnant. If you drink alcohol: Limit how much you have to: 0-1 drink a day for women. 0-2 drinks a day for men. Know how much alcohol is in your drink. In the [place] one drink equals one 12 oz bottle of beer (355 mL), one 5 oz glass of wine (148 mL), or one 1 oz glass of hard liquor (44 mL). Lifestyle  Work with your health care provider to maintain a healthy body weight or to lose weight. Ask what an ideal weight is for you. Get at least 30 minutes of exercise that causes your heart to beat faster (aerobic exercise) most days of the week. Activities may include walking, swimming, or biking. Include exercise to strengthen your muscles (resistance exercise), such as Pilates or lifting weights, as part of your weekly exercise routine. Try to do these types of exercises for 30 minutes at least 3 days a week. Do not use any products that contain nicotine or tobacco. These products include cigarettes, chewing tobacco, and vaping devices, such as e-cigarettes. If you need help quitting, ask your health care provider. Monitor your blood pressure at home as told by your health care provider. Keep all follow-up visits. This is important. Medicines Take over-the-counter and prescription medicines only as told by your health care provider. Follow directions carefully. Blood  pressure medicines must be taken as prescribed. Do not skip doses of blood pressure medicine. Doing this puts you at risk for problems and can make the medicine less effective. Ask your health care provider about side effects or reactions to medicines that you should watch for. Contact a health care provider if you: Think you are having a reaction to a medicine you are taking. Have headaches that keep coming back (recurring). Feel dizzy. Have swelling in your ankles. Have trouble with your vision. Get help right away if you: Develop a severe headache or confusion. Have unusual weakness or numbness. Feel faint. Have severe pain in your chest or abdomen. Vomit repeatedly. Have trouble breathing. These symptoms may be an emergency. Get help right away. Call 911. Do not wait to see if the symptoms will go away. Do not drive yourself to the hospital. Summary Hypertension is when the force of blood pumping through your arteries is too strong. If this condition is not controlled, it may put you at risk for serious complications. Your personal target blood pressure may vary depending on your medical conditions, your age, and other factors. For most people, a normal blood pressure is less than 120/80. Hypertension is treated with lifestyle changes, medicines, or a combination of both. Lifestyle changes include losing weight, eating a healthy,   low-sodium diet, exercising more, and limiting alcohol. This information is not intended to replace advice given to you by your health care provider. Make sure you discuss any questions you have with your health care provider. Document Revised: 04/26/2021 Document Reviewed: 04/26/2021 Elsevier Patient Education  2024 Elsevier Inc.  

## 2022-11-30 NOTE — Assessment & Plan Note (Signed)
Resolved recently needs to continue to stay active

## 2022-11-30 NOTE — Assessment & Plan Note (Signed)
With increased bloating especially after eating and some pain radiating to behind right shoulder blade. She feels much better now as compared to her last visit and she believes she had a gallstone that has passed. Encouraged to maintain a diet minimizing processed and fatty foods and to notify us if pain returns so we can proceed with Ultrasound

## 2022-11-30 NOTE — Progress Notes (Signed)
Subjective:   By signing my name below, I, Molly Snyder, attest that this documentation has been prepared under the direction and in the presence of Molly Canary, MD., 11/30/2022.   Patient ID: Molly Snyder, female    DOB: [birth year], 62 y.o.   MRN: [id number]  Chief Complaint  Patient presents with   Follow-up    Follow up    HPI Patient is in today for an office visit. She denies recent hospitalization, febrile illness, CP/palpitations/SOB/HA/fever/chills/GI or GU symptoms.  Low Back and Right Hip Pain Patient was last seen on 08/21/2022 where she presented with right hip pain and low back pain. She reports that since then, the hip and back pain worsened until only a few days ago. She suspects that she passed a gallbladder stone and now denies any back or hip pain.   Tobacco Patient continues smoking cigarettes regularly which she states is due to stress. However, she is considering smoking cessation.  Vitamin D Deficiency Patient was seen by Dr. Willow Ora on 01/20/2022 who prescribed vitamin D 50,000 IU once weekly for 12 weeks. She has completed this 12-week regimen and is not currently taking supplemental vitamin D. She denies fatigue or weakness but blood work today will recheck vitamin D levels and then a recommendation will be made for a daily vitamin D strength.  Past Medical History:  Diagnosis Date   Allergic state 10/25/2014   Breast lesion 09/02/2014   Left axillae    Chest pain 09/02/2014   Hyperlipidemia, mixed 10/25/2014   Right bundle branch block    Snoring 09/02/2014   Tobacco use disorder 09/02/2014    Past Surgical History:  Procedure Laterality Date   pylonidal cystectomy      Family History  Problem Relation Age of Onset   Throat cancer Mother    Heart murmur Mother    Heart disease Father    Heart attack Father 35   Heart attack Paternal Grandfather    Heart attack Maternal Uncle    Colon cancer Neg Hx    Breast cancer Neg Hx     Social  History   Socioeconomic History   Marital status: Single    Spouse name: Not on file   Number of children: 0   Years of education: Not on file   Highest education level: Not on file  Occupational History   Occupation: IV tech at [employer]  Tobacco Use   Smoking status: Some Days    Types: Cigarettes   Smokeless tobacco: Never  Vaping Use   Vaping Use: Never used  Substance and Sexual Activity   Alcohol use: Not Currently   Drug use: No   Sexual activity: Yes    Birth control/protection: None  Other Topics Concern   Not on file  Social History Narrative   Live by herself    Social Determinants of Health   Financial Resource Strain: Not on file  Food Insecurity: Not on file  Transportation Needs: Not on file  Physical Activity: Not on file  Stress: Not on file  Social Connections: Not on file  Intimate Partner Violence: Not on file    Outpatient Medications Prior to Visit  Medication Sig Dispense Refill   ALPRAZolam (XANAX) 0.25 MG tablet Take 1 tablet (0.25 mg total) by mouth 2 (two) times daily as needed for anxiety. 60 tablet 1   amLODipine (NORVASC) 2.5 MG tablet Take 1 tablet (2.5 mg total) by mouth daily. 90 tablet 1   aspirin  EC 81 MG tablet Take 1 tablet (81 mg total) by mouth daily.     atorvastatin (LIPITOR) 40 MG tablet Take 1 tablet (40 mg total) by mouth at bedtime. 90 tablet 1   escitalopram (LEXAPRO) 10 MG tablet Take 1 tablet (10 mg total) by mouth daily. 90 tablet 0   loratadine (CLARITIN) 10 MG tablet Take 10 mg by mouth daily.     nitroGLYCERIN (NITROSTAT) 0.4 MG SL tablet DISSOLVE 1 TABLET UNDER THE TONGUE EVERY 5 MINUTES AS NEEDED FOR CHEST PAIN 25 tablet 3   Vitamin D, Ergocalciferol, (DRISDOL) 1.25 MG (50000 UNIT) CAPS capsule Take 1 capsule (50,000 Units total) by mouth every 7 (seven) days. For 12 weeks 12 capsule 0   No facility-administered medications prior to visit.    Allergies  Allergen Reactions   Penicillins Anaphylaxis    Amoxicillin Rash    Review of Systems  Constitutional:  Negative for chills, fever and malaise/fatigue.  Respiratory:  Negative for shortness of breath.   Cardiovascular:  Negative for chest pain and palpitations.  Gastrointestinal:  Negative for abdominal pain, blood in stool, constipation, diarrhea, nausea and vomiting.  Genitourinary:  Negative for dysuria, frequency, hematuria and urgency.  Musculoskeletal:  Negative for back pain, joint pain and myalgias.  Skin:           Neurological:  Negative for weakness and headaches.       Objective:    Physical Exam Constitutional:      General: She is not in acute distress.    Appearance: Normal appearance. She is not ill-appearing.  HENT:     Head: Normocephalic and atraumatic.     Right Ear: External ear normal.     Left Ear: External ear normal.     Nose: Nose normal.     Mouth/Throat:     Mouth: Mucous membranes are moist.     Pharynx: Oropharynx is clear.  Eyes:     General:        Right eye: No discharge.        Left eye: No discharge.     Extraocular Movements: Extraocular movements intact.     Conjunctiva/sclera: Conjunctivae normal.     Pupils: Pupils are equal, round, and reactive to light.  Cardiovascular:     Rate and Rhythm: Normal rate and regular rhythm.     Pulses: Normal pulses.     Heart sounds: Normal heart sounds. No murmur heard.    No gallop.  Pulmonary:     Effort: Pulmonary effort is normal. No respiratory distress.     Breath sounds: Normal breath sounds. No wheezing or rales.  Abdominal:     General: Bowel sounds are normal.     Palpations: Abdomen is soft. There is no mass.     Tenderness: There is no abdominal tenderness. There is no guarding.  Musculoskeletal:        General: Normal range of motion.     Cervical back: Normal range of motion.     Right lower leg: No edema.     Left lower leg: No edema.  Skin:    General: Skin is warm and dry.  Neurological:     Mental Status: She is  alert and oriented to person, place, and time.  Psychiatric:        Mood and Affect: Mood normal.        Behavior: Behavior normal.        Judgment: Judgment normal.     BP 116/72 (  BP Location: Left Arm, Patient Position: Sitting, Cuff Size: Normal)   Pulse 75   Temp 97.8 F (36.6 C) (Oral)   Resp 16   Ht 5\' 4"  (1.626 m)   Wt 145 lb (65.8 kg)   SpO2 96%   BMI 24.89 kg/m  Wt Readings from Last 3 Encounters:  11/30/22 145 lb (65.8 kg)  08/21/22 143 lb 12.8 oz (65.2 kg)  03/25/22 138 lb 14.2 oz (63 kg)    Diabetic Foot Exam - Simple   No data filed    Lab Results  Component Value Date   WBC 8.1 08/21/2022   HGB 14.1 08/21/2022   HCT 42.5 08/21/2022   PLT 363.0 08/21/2022   GLUCOSE 96 08/21/2022   CHOL 217 (H) 08/21/2022   TRIG 153.0 (H) 08/21/2022   HDL 60.70 08/21/2022   LDLDIRECT 266.0 01/18/2022   LDLCALC 126 (H) 08/21/2022   ALT 14 08/21/2022   AST 15 08/21/2022   NA 137 08/21/2022   K 4.4 08/21/2022   CL 105 08/21/2022   CREATININE 0.72 08/21/2022   BUN 16 08/21/2022   CO2 24 08/21/2022   TSH 1.07 08/21/2022   HGBA1C 6.0 08/21/2022    Lab Results  Component Value Date   TSH 1.07 08/21/2022   Lab Results  Component Value Date   WBC 8.1 08/21/2022   HGB 14.1 08/21/2022   HCT 42.5 08/21/2022   MCV 90.1 08/21/2022   PLT 363.0 08/21/2022   Lab Results  Component Value Date   NA 137 08/21/2022   K 4.4 08/21/2022   CO2 24 08/21/2022   GLUCOSE 96 08/21/2022   BUN 16 08/21/2022   CREATININE 0.72 08/21/2022   BILITOT 0.5 08/21/2022   ALKPHOS 63 08/21/2022   AST 15 08/21/2022   ALT 14 08/21/2022   PROT 6.4 08/21/2022   ALBUMIN 4.0 08/21/2022   CALCIUM 9.4 08/21/2022   ANIONGAP 7 03/25/2022   GFR 90.26 08/21/2022   Lab Results  Component Value Date   CHOL 217 (H) 08/21/2022   Lab Results  Component Value Date   HDL 60.70 08/21/2022   Lab Results  Component Value Date   LDLCALC 126 (H) 08/21/2022   Lab Results  Component Value Date    TRIG 153.0 (H) 08/21/2022   Lab Results  Component Value Date   CHOLHDL 4 08/21/2022   Lab Results  Component Value Date   HGBA1C 6.0 08/21/2022      Assessment & Plan:  Healthy Lifestyle: Encouraged 6-8 hours of sleep, heart healthy diet, 60-80 oz of non-alcohol/non-caffeinated fluids, and 4000-8000 steps daily.  Labs: Routine blood work ordered.  Low Back and Right Hip Pain: This has resolved and patient suspects she passed a gallbladder stone.  Tobacco: Encouraged smoking cessation.  Vitamin D Deficiency: Encouraged patient to take vitamin D daily. Blood work today will recheck vitamin D level and recommended daily strength will then be determined. Problem List Items Addressed This Visit     Cervical cancer screening    Pap today, no concerns on exam.        Hyperglycemia - Primary    hgba1c acceptable, minimize simple carbs. Increase exercise as tolerated.       Relevant Orders   HgB A1c   Hyperlipidemia    Tolerating statin, encouraged heart healthy diet, avoid trans fats, minimize simple carbs and saturated fats. Increase exercise as tolerated      Relevant Orders   Lipid panel   Hypertension    Well controlled, no  changes to meds. Encouraged heart healthy diet such as the DASH diet and exercise as tolerated.       Relevant Orders   CBC with Differential/Platelet   Comprehensive metabolic panel   TSH   Vitamin D deficiency    Supplement and monitor       Relevant Orders   VITAMIN D 25 Hydroxy (Vit-D Deficiency, Fractures)   Right hip pain    Resolved recently needs to continue to stay active      RUQ pain    With increased bloating especially after eating and some pain radiating to behind right shoulder blade. She feels much better now as compared to her last visit and she believes she had a gallstone that has passed. Encouraged to maintain a diet minimizing processed and fatty foods and to notify us if pain returns so we can proceed with  Ultrasound      Tobacco use disorder    Encouraged complete cessation. Important to quit as relates to risk of numerous cancers, cardiac and pulmonary disease as well as neurologic complications. Less than 1 ppd      No orders of the defined types were placed in this encounter.  I, Danise Edge, MD, personally preformed the services described in this documentation.  All medical record entries made by the scribe were at my direction and in my presence.  I have reviewed the chart and discharge instructions (if applicable) and agree that the record reflects my personal performance and is accurate and complete. 11/30/2022  I,Mohammed Iqbal,acting as a scribe for Danise Edge, MD.,have documented all relevant documentation on the behalf of Danise Edge, MD,as directed by  Danise Edge, MD while in the presence of Danise Edge, MD.  Danise Edge, MD

## 2022-12-01 ENCOUNTER — Other Ambulatory Visit (HOSPITAL_BASED_OUTPATIENT_CLINIC_OR_DEPARTMENT_OTHER): Payer: Self-pay

## 2022-12-01 ENCOUNTER — Other Ambulatory Visit: Payer: Self-pay

## 2022-12-01 MED ORDER — VITAMIN D (ERGOCALCIFEROL) 1.25 MG (50000 UNIT) PO CAPS
50000.0000 [IU] | ORAL_CAPSULE | ORAL | 4 refills | Status: DC
  Filled 2022-12-01: qty 12, 84d supply, fill #0
  Filled 2023-04-15: qty 12, 84d supply, fill #1
  Filled 2023-10-03: qty 1, 7d supply, fill #2
  Filled 2023-10-10: qty 12, 84d supply, fill #2

## 2023-02-26 ENCOUNTER — Other Ambulatory Visit: Payer: Self-pay | Admitting: Family Medicine

## 2023-02-26 ENCOUNTER — Other Ambulatory Visit (HOSPITAL_BASED_OUTPATIENT_CLINIC_OR_DEPARTMENT_OTHER): Payer: Self-pay

## 2023-02-26 MED ORDER — ESCITALOPRAM OXALATE 10 MG PO TABS
10.0000 mg | ORAL_TABLET | Freq: Every day | ORAL | 0 refills | Status: DC
  Filled 2023-02-26: qty 90, 90d supply, fill #0

## 2023-04-15 ENCOUNTER — Other Ambulatory Visit: Payer: Self-pay | Admitting: Family Medicine

## 2023-04-16 ENCOUNTER — Other Ambulatory Visit (HOSPITAL_BASED_OUTPATIENT_CLINIC_OR_DEPARTMENT_OTHER): Payer: Self-pay

## 2023-04-16 MED ORDER — AMLODIPINE BESYLATE 2.5 MG PO TABS
2.5000 mg | ORAL_TABLET | Freq: Every day | ORAL | 1 refills | Status: DC
  Filled 2023-04-16: qty 90, 90d supply, fill #0
  Filled 2023-09-30: qty 90, 90d supply, fill #1

## 2023-04-16 MED ORDER — ATORVASTATIN CALCIUM 40 MG PO TABS
40.0000 mg | ORAL_TABLET | Freq: Every day | ORAL | 1 refills | Status: DC
  Filled 2023-04-16: qty 90, 90d supply, fill #0
  Filled 2023-09-30: qty 90, 90d supply, fill #1

## 2023-06-22 ENCOUNTER — Other Ambulatory Visit: Payer: Self-pay | Admitting: Family Medicine

## 2023-06-25 ENCOUNTER — Other Ambulatory Visit (HOSPITAL_BASED_OUTPATIENT_CLINIC_OR_DEPARTMENT_OTHER): Payer: Self-pay

## 2023-06-25 MED ORDER — ESCITALOPRAM OXALATE 10 MG PO TABS
10.0000 mg | ORAL_TABLET | Freq: Every day | ORAL | 0 refills | Status: DC
  Filled 2023-06-25: qty 30, 30d supply, fill #0

## 2023-08-19 ENCOUNTER — Other Ambulatory Visit: Payer: Self-pay | Admitting: Family Medicine

## 2023-08-20 ENCOUNTER — Other Ambulatory Visit (HOSPITAL_BASED_OUTPATIENT_CLINIC_OR_DEPARTMENT_OTHER): Payer: Self-pay

## 2023-08-20 MED ORDER — ESCITALOPRAM OXALATE 10 MG PO TABS
10.0000 mg | ORAL_TABLET | Freq: Every day | ORAL | 0 refills | Status: DC
  Filled 2023-08-20: qty 30, 30d supply, fill #0

## 2023-09-30 ENCOUNTER — Other Ambulatory Visit: Payer: Self-pay | Admitting: Family Medicine

## 2023-10-02 ENCOUNTER — Other Ambulatory Visit (HOSPITAL_BASED_OUTPATIENT_CLINIC_OR_DEPARTMENT_OTHER): Payer: Self-pay

## 2023-10-03 ENCOUNTER — Ambulatory Visit: Admitting: Internal Medicine

## 2023-10-03 ENCOUNTER — Encounter: Payer: Self-pay | Admitting: Internal Medicine

## 2023-10-03 ENCOUNTER — Encounter (HOSPITAL_BASED_OUTPATIENT_CLINIC_OR_DEPARTMENT_OTHER): Payer: Self-pay

## 2023-10-03 ENCOUNTER — Other Ambulatory Visit: Payer: Self-pay | Admitting: Family Medicine

## 2023-10-03 ENCOUNTER — Other Ambulatory Visit (HOSPITAL_BASED_OUTPATIENT_CLINIC_OR_DEPARTMENT_OTHER): Payer: Self-pay

## 2023-10-03 VITALS — BP 132/80 | HR 93 | Temp 98.1°F | Resp 16 | Ht 64.0 in | Wt 150.0 lb

## 2023-10-03 DIAGNOSIS — Z0000 Encounter for general adult medical examination without abnormal findings: Secondary | ICD-10-CM

## 2023-10-03 DIAGNOSIS — Z01419 Encounter for gynecological examination (general) (routine) without abnormal findings: Secondary | ICD-10-CM

## 2023-10-03 DIAGNOSIS — F419 Anxiety disorder, unspecified: Secondary | ICD-10-CM

## 2023-10-03 DIAGNOSIS — E7801 Familial hypercholesterolemia: Secondary | ICD-10-CM

## 2023-10-03 DIAGNOSIS — I10 Essential (primary) hypertension: Secondary | ICD-10-CM

## 2023-10-03 DIAGNOSIS — Z0001 Encounter for general adult medical examination with abnormal findings: Secondary | ICD-10-CM | POA: Diagnosis not present

## 2023-10-03 DIAGNOSIS — E559 Vitamin D deficiency, unspecified: Secondary | ICD-10-CM | POA: Diagnosis not present

## 2023-10-03 DIAGNOSIS — R739 Hyperglycemia, unspecified: Secondary | ICD-10-CM | POA: Diagnosis not present

## 2023-10-03 DIAGNOSIS — Z1211 Encounter for screening for malignant neoplasm of colon: Secondary | ICD-10-CM

## 2023-10-03 DIAGNOSIS — R1011 Right upper quadrant pain: Secondary | ICD-10-CM

## 2023-10-03 DIAGNOSIS — Z23 Encounter for immunization: Secondary | ICD-10-CM | POA: Diagnosis not present

## 2023-10-03 DIAGNOSIS — Z79899 Other long term (current) drug therapy: Secondary | ICD-10-CM | POA: Diagnosis not present

## 2023-10-03 DIAGNOSIS — F32A Depression, unspecified: Secondary | ICD-10-CM

## 2023-10-03 DIAGNOSIS — Z1231 Encounter for screening mammogram for malignant neoplasm of breast: Secondary | ICD-10-CM

## 2023-10-03 LAB — COMPREHENSIVE METABOLIC PANEL WITH GFR
ALT: 20 U/L (ref 0–35)
AST: 19 U/L (ref 0–37)
Albumin: 4.6 g/dL (ref 3.5–5.2)
Alkaline Phosphatase: 104 U/L (ref 39–117)
BUN: 14 mg/dL (ref 6–23)
CO2: 28 meq/L (ref 19–32)
Calcium: 10.3 mg/dL (ref 8.4–10.5)
Chloride: 100 meq/L (ref 96–112)
Creatinine, Ser: 0.79 mg/dL (ref 0.40–1.20)
GFR: 80.12 mL/min (ref >60.00)
Glucose, Bld: 93 mg/dL (ref 70–99)
Potassium: 5.2 meq/L — ABNORMAL HIGH (ref 3.5–5.1)
Sodium: 139 meq/L (ref 135–145)
Total Bilirubin: 0.5 mg/dL (ref 0.2–1.2)
Total Protein: 7.5 g/dL (ref 6.0–8.3)

## 2023-10-03 LAB — CBC WITH DIFFERENTIAL/PLATELET
Basophils Absolute: 0.1 10*3/uL (ref 0.0–0.1)
Basophils Relative: 0.6 % (ref 0.0–3.0)
Eosinophils Absolute: 0.4 10*3/uL (ref 0.0–0.7)
Eosinophils Relative: 3.3 % (ref 0.0–5.0)
HCT: 44 % (ref 36.0–46.0)
Hemoglobin: 14.6 g/dL (ref 12.0–15.0)
Lymphocytes Relative: 21.5 % (ref 12.0–46.0)
Lymphs Abs: 2.3 10*3/uL (ref 0.7–4.0)
MCHC: 33.1 g/dL (ref 30.0–36.0)
MCV: 91.7 fl (ref 78.0–100.0)
Monocytes Absolute: 0.9 10*3/uL (ref 0.1–1.0)
Monocytes Relative: 8.2 % (ref 3.0–12.0)
Neutro Abs: 7 10*3/uL (ref 1.4–7.7)
Neutrophils Relative %: 66.4 % (ref 43.0–77.0)
Platelets: 445 10*3/uL — ABNORMAL HIGH (ref 150.0–400.0)
RBC: 4.79 Mil/uL (ref 3.87–5.11)
RDW: 14.7 % (ref 11.5–15.5)
WBC: 10.6 10*3/uL — ABNORMAL HIGH (ref 4.0–10.5)

## 2023-10-03 LAB — LIPID PANEL
Cholesterol: 241 mg/dL — ABNORMAL HIGH (ref 0–200)
HDL: 56.9 mg/dL (ref >39.00)
NonHDL: 184.14
Total CHOL/HDL Ratio: 4
Triglycerides: 405 mg/dL — ABNORMAL HIGH (ref 0.0–149.0)
VLDL: 81 mg/dL — ABNORMAL HIGH (ref 0.0–40.0)

## 2023-10-03 LAB — LDL CHOLESTEROL, DIRECT: Direct LDL: 141 mg/dL

## 2023-10-03 LAB — HEMOGLOBIN A1C: Hgb A1c MFr Bld: 6.4 % (ref 4.6–6.5)

## 2023-10-03 LAB — VITAMIN D 25 HYDROXY (VIT D DEFICIENCY, FRACTURES): VITD: 31.81 ng/mL (ref 30.00–100.00)

## 2023-10-03 MED ORDER — ATORVASTATIN CALCIUM 40 MG PO TABS
40.0000 mg | ORAL_TABLET | Freq: Every day | ORAL | 1 refills | Status: AC
  Filled 2023-10-03 – 2023-12-30 (×2): qty 90, 90d supply, fill #0
  Filled 2024-04-17: qty 90, 90d supply, fill #1

## 2023-10-03 MED ORDER — ALPRAZOLAM 0.25 MG PO TABS
0.2500 mg | ORAL_TABLET | Freq: Two times a day (BID) | ORAL | 1 refills | Status: AC | PRN
  Filled 2023-10-03: qty 60, 30d supply, fill #0

## 2023-10-03 MED ORDER — ESCITALOPRAM OXALATE 10 MG PO TABS
10.0000 mg | ORAL_TABLET | Freq: Every day | ORAL | 1 refills | Status: DC
  Filled 2023-10-03 – 2023-10-04 (×2): qty 90, 90d supply, fill #0
  Filled 2023-12-30: qty 90, 90d supply, fill #1

## 2023-10-03 MED ORDER — AMLODIPINE BESYLATE 2.5 MG PO TABS
2.5000 mg | ORAL_TABLET | Freq: Every day | ORAL | 1 refills | Status: AC
  Filled 2023-10-03 – 2023-12-30 (×2): qty 90, 90d supply, fill #0
  Filled 2024-04-17: qty 90, 90d supply, fill #1

## 2023-10-03 MED ORDER — NITROGLYCERIN 0.4 MG SL SUBL
0.4000 mg | SUBLINGUAL_TABLET | SUBLINGUAL | 3 refills | Status: AC | PRN
  Filled 2023-10-03: qty 25, 5d supply, fill #0

## 2023-10-03 NOTE — Patient Instructions (Addendum)
 Vaccines I recommend: Flu shot every fall COVID booster if not done in the last 6 months.  GO TO THE LAB : Get the blood work     Please go to the front desk: Arrange follow-up with your PCP in 4 months    STOP BY THE FIRST FLOOR:  Arrange for a mammogram Arrange for a abdominal ultrasound to check your gallbladder      "Health Care Power of attorney" (Also know as a  "Living will" or  Advance care planning documents)  If you already have a living will or healthcare power of attorney, is recommended you bring the copy to be scanned in your chart.   The document will be available to all the doctors you see in the system.  If you are over 69 y/o and don't have the document, please read:  Advance care planning is a process that supports adults in  understanding and sharing their preferences regarding future medical care.  The patient's preferences are recorded in documents called Advance Directives and the can be modified at any time while the patient is in full mental capacity.     More information at: [email or web address]

## 2023-10-03 NOTE — Progress Notes (Unsigned)
 Subjective:    Patient ID: Molly Snyder, female    DOB: 05/22/1961, 63 y.o.   MRN: [id number]  DOS:  10/03/2023 Type of visit - description: Requests med refills and a CPX  In general feeling well. Denies chest pain or difficulty breathing Had a sinus infection 3 weeks ago much improved, still have some postnasal drip. Has not seen the gynecologist in a while, denies any vaginal discharge or bleeding. Has episodic RUQ abdominal pain, sometimes associated with bandlike pain at the upper abdomen. No other GI symptoms.  Review of Systems See above   Past Medical History:  Diagnosis Date   Allergic state 10/25/2014   Breast lesion 09/02/2014   Left axillae    Chest pain 09/02/2014   Hyperlipidemia, mixed 10/25/2014   Right bundle branch block    Snoring 09/02/2014   Tobacco use disorder 09/02/2014    Past Surgical History:  Procedure Laterality Date   pylonidal cystectomy      Current Outpatient Medications  Medication Instructions   ALPRAZolam (XANAX) 0.25 mg, Oral, 2 times daily PRN   amLODipine (NORVASC) 2.5 mg, Oral, Daily   aspirin EC 81 mg, Oral, Daily   atorvastatin (LIPITOR) 40 mg, Oral, Daily at bedtime   escitalopram (LEXAPRO) 10 mg, Oral, Daily   loratadine (CLARITIN) 10 mg, Daily   nitroGLYCERIN (NITROSTAT) 0.4 MG SL tablet DISSOLVE 1 TABLET UNDER THE TONGUE EVERY 5 MINUTES AS NEEDED FOR CHEST PAIN   Vitamin D (Ergocalciferol) (DRISDOL) 50,000 Units, Oral, Every 7 days, For 12 weeks       Objective:   Physical Exam BP 132/80   Pulse 93   Temp 98.1 F (36.7 C) (Oral)   Resp 16   Ht 5\' 4"  (1.626 m)   Wt 150 lb (68 kg)   SpO2 96%   BMI 25.75 kg/m  General: Well developed, NAD, BMI noted Neck: No  thyromegaly  HEENT:  Normocephalic . Face symmetric, atraumatic Lungs:  CTA B Normal respiratory effort, no intercostal retractions, no accessory muscle use. Heart: RRR,  no murmur.  Abdomen:  Not distended, soft, non-tender. No rebound or rigidity.    Lower extremities: no pretibial edema bilaterally  Skin: Exposed areas without rash. Not pale. Not jaundice Neurologic:  alert & oriented X3.  Speech normal, gait appropriate for age and unassisted Strength symmetric and appropriate for age.  Psych: Cognition and judgment appear intact.  Cooperative with normal attention span and concentration.  Behavior appropriate. No anxious or depressed appearing.     Assessment    PMH includes hyperglycemia, TIA, + FH CAD, CAD per CT, RBBB, anxiety depression, hyperglycemia, high cholesterol, osteopenia, Vit d def , menopause age 67  Requests a physical exam Tdap: 2023  Shingrix x 2:~ 2021 per pt @ [facility] 20-today Vaccines I recommend: Flu shot every fall,  COVID booster if not done within the last 6 months  Female care:  Has not seen gynecology in a while.  Referral sent No recent mammogram: Order placed, strongly recommend to proceed.    CCS: Previous referral to Cologuard failed, 3 options discussed.  Elected Cologuard again. Lung cancer screening: Smoked half pack a day for approximately 37 years thus 17 pack-pack history.  Does not qualify.   Labs: CMP FLP CBC A1c Vit d Diet and exercise discussed Other issues: CAD: Asymptomatic.  Refilled nitroglycerin. Anxiety depression: On Lexapro, rarely takes Xanax.  Well-controlled.  PDMP okay, Xanax refilled. Hyperglycemia: Check A1c High cholesterol: On atorvastatin, checking labs HTN: On  amlodipine, BP looks good, recommend to check at home. Vitamin D def, takes ergocalciferol every week.  Check levels RUQ abdominal pain: On and off, check [place], rule out gallbladder problems. Osteopenia: T-score 12/2021  was (-) 1.9 with a low FRAX.  Recommend physical activity, vitamin D, next DEXA in 2 to 3 years. RTC to see PCP 4 months.

## 2023-10-04 ENCOUNTER — Other Ambulatory Visit (HOSPITAL_BASED_OUTPATIENT_CLINIC_OR_DEPARTMENT_OTHER): Payer: Self-pay

## 2023-10-04 ENCOUNTER — Encounter: Payer: Self-pay | Admitting: Internal Medicine

## 2023-10-05 ENCOUNTER — Other Ambulatory Visit (HOSPITAL_BASED_OUTPATIENT_CLINIC_OR_DEPARTMENT_OTHER): Payer: Self-pay

## 2023-10-05 ENCOUNTER — Encounter: Payer: Self-pay | Admitting: Internal Medicine

## 2023-10-05 MED ORDER — EZETIMIBE 10 MG PO TABS
10.0000 mg | ORAL_TABLET | Freq: Every day | ORAL | 3 refills | Status: AC
  Filled 2023-10-05: qty 90, 90d supply, fill #0
  Filled 2023-12-30: qty 90, 90d supply, fill #1
  Filled 2024-04-17: qty 90, 90d supply, fill #2

## 2023-10-05 NOTE — Addendum Note (Signed)
 Addended by: Conrad Trainer D on: 10/05/2023 10:37 AM   Modules accepted: Orders

## 2023-10-06 LAB — DRUG MONITORING PANEL 375977 , URINE

## 2023-10-06 LAB — DM TEMPLATE

## 2023-10-10 ENCOUNTER — Other Ambulatory Visit: Payer: Self-pay | Admitting: Family Medicine

## 2023-10-10 ENCOUNTER — Other Ambulatory Visit (HOSPITAL_BASED_OUTPATIENT_CLINIC_OR_DEPARTMENT_OTHER): Payer: Self-pay

## 2023-10-10 MED ORDER — VITAMIN D (ERGOCALCIFEROL) 1.25 MG (50000 UNIT) PO CAPS
50000.0000 [IU] | ORAL_CAPSULE | ORAL | 4 refills | Status: AC
  Filled 2023-10-10: qty 5, 35d supply, fill #0
  Filled 2023-12-13: qty 5, 35d supply, fill #1
  Filled 2024-01-20: qty 5, 35d supply, fill #2
  Filled 2024-04-17: qty 5, 35d supply, fill #3

## 2023-10-11 ENCOUNTER — Encounter (HOSPITAL_BASED_OUTPATIENT_CLINIC_OR_DEPARTMENT_OTHER): Payer: Self-pay

## 2023-10-11 ENCOUNTER — Ambulatory Visit (HOSPITAL_BASED_OUTPATIENT_CLINIC_OR_DEPARTMENT_OTHER)
Admission: RE | Admit: 2023-10-11 | Discharge: 2023-10-11 | Disposition: A | Discharge status: Home / Self Care | Source: Ambulatory Visit | Attending: Internal Medicine | Admitting: Internal Medicine

## 2023-10-11 ENCOUNTER — Other Ambulatory Visit (HOSPITAL_BASED_OUTPATIENT_CLINIC_OR_DEPARTMENT_OTHER): Payer: Self-pay

## 2023-10-11 DIAGNOSIS — Z1231 Encounter for screening mammogram for malignant neoplasm of breast: Secondary | ICD-10-CM | POA: Diagnosis not present

## 2023-10-11 DIAGNOSIS — R1011 Right upper quadrant pain: Secondary | ICD-10-CM | POA: Diagnosis not present

## 2023-10-12 ENCOUNTER — Encounter: Payer: Self-pay | Admitting: Internal Medicine

## 2023-12-31 ENCOUNTER — Other Ambulatory Visit (HOSPITAL_BASED_OUTPATIENT_CLINIC_OR_DEPARTMENT_OTHER): Payer: Self-pay

## 2023-12-31 ENCOUNTER — Other Ambulatory Visit: Payer: Self-pay

## 2024-04-17 ENCOUNTER — Other Ambulatory Visit: Payer: Self-pay | Admitting: Internal Medicine

## 2024-04-17 ENCOUNTER — Encounter: Payer: Self-pay | Admitting: Family Medicine

## 2024-04-17 ENCOUNTER — Ambulatory Visit: Admitting: Family Medicine

## 2024-04-17 VITALS — BP 138/67 | HR 67 | Temp 98.7°F | Resp 16 | Ht 66.0 in | Wt 155.0 lb

## 2024-04-17 DIAGNOSIS — F419 Anxiety disorder, unspecified: Secondary | ICD-10-CM

## 2024-04-17 DIAGNOSIS — R739 Hyperglycemia, unspecified: Secondary | ICD-10-CM | POA: Diagnosis not present

## 2024-04-17 DIAGNOSIS — E559 Vitamin D deficiency, unspecified: Secondary | ICD-10-CM

## 2024-04-17 DIAGNOSIS — I2510 Atherosclerotic heart disease of native coronary artery without angina pectoris: Secondary | ICD-10-CM | POA: Diagnosis not present

## 2024-04-17 DIAGNOSIS — I10 Essential (primary) hypertension: Secondary | ICD-10-CM | POA: Diagnosis not present

## 2024-04-17 DIAGNOSIS — F32A Depression, unspecified: Secondary | ICD-10-CM | POA: Diagnosis not present

## 2024-04-17 DIAGNOSIS — E78019 Familial hypercholesterolemia, unspecified: Secondary | ICD-10-CM

## 2024-04-17 DIAGNOSIS — Z1211 Encounter for screening for malignant neoplasm of colon: Secondary | ICD-10-CM

## 2024-04-17 LAB — COMPREHENSIVE METABOLIC PANEL WITH GFR
ALT: 17 U/L (ref 0–35)
AST: 16 U/L (ref 0–37)
Albumin: 4.5 g/dL (ref 3.5–5.2)
Alkaline Phosphatase: 92 U/L (ref 39–117)
BUN: 21 mg/dL (ref 6–23)
CO2: 27 meq/L (ref 19–32)
Calcium: 9.9 mg/dL (ref 8.4–10.5)
Chloride: 103 meq/L (ref 96–112)
Creatinine, Ser: 0.8 mg/dL (ref 0.40–1.20)
GFR: 78.62 mL/min (ref >60.00)
Glucose, Bld: 89 mg/dL (ref 70–99)
Potassium: 5.3 meq/L — ABNORMAL HIGH (ref 3.5–5.1)
Sodium: 139 meq/L (ref 135–145)
Total Bilirubin: 0.4 mg/dL (ref 0.2–1.2)
Total Protein: 7 g/dL (ref 6.0–8.3)

## 2024-04-17 LAB — LIPID PANEL
Cholesterol: 162 mg/dL (ref 0–200)
HDL: 59 mg/dL (ref >39.00)
LDL Cholesterol: 64 mg/dL (ref 0–99)
NonHDL: 103.33
Total CHOL/HDL Ratio: 3
Triglycerides: 195 mg/dL — ABNORMAL HIGH (ref 0.0–149.0)
VLDL: 39 mg/dL (ref 0.0–40.0)

## 2024-04-17 LAB — HEMOGLOBIN A1C: Hgb A1c MFr Bld: 6.2 % (ref 4.6–6.5)

## 2024-04-17 LAB — CBC WITH DIFFERENTIAL/PLATELET
Basophils Absolute: 0.1 K/uL (ref 0.0–0.1)
Basophils Relative: 0.9 % (ref 0.0–3.0)
Eosinophils Absolute: 0.3 K/uL (ref 0.0–0.7)
Eosinophils Relative: 2.4 % (ref 0.0–5.0)
HCT: 42.8 % (ref 36.0–46.0)
Hemoglobin: 14.1 g/dL (ref 12.0–15.0)
Lymphocytes Relative: 21.5 % (ref 12.0–46.0)
Lymphs Abs: 2.6 K/uL (ref 0.7–4.0)
MCHC: 32.8 g/dL (ref 30.0–36.0)
MCV: 89.8 fl (ref 78.0–100.0)
Monocytes Absolute: 0.9 K/uL (ref 0.1–1.0)
Monocytes Relative: 7.9 % (ref 3.0–12.0)
Neutro Abs: 8 K/uL — ABNORMAL HIGH (ref 1.4–7.7)
Neutrophils Relative %: 67.3 % (ref 43.0–77.0)
Platelets: 398 K/uL (ref 150.0–400.0)
RBC: 4.77 Mil/uL (ref 3.87–5.11)
RDW: 14.4 % (ref 11.5–15.5)
WBC: 11.9 K/uL — ABNORMAL HIGH (ref 4.0–10.5)

## 2024-04-17 LAB — TSH: TSH: 1.74 u[IU]/mL (ref 0.35–5.50)

## 2024-04-17 LAB — VITAMIN D 25 HYDROXY (VIT D DEFICIENCY, FRACTURES): VITD: 36.28 ng/mL (ref 30.00–100.00)

## 2024-04-17 NOTE — Patient Instructions (Signed)
 Hypertension, Adult High blood pressure (hypertension) is when the force of blood pumping through the arteries is too strong. The arteries are the blood vessels that carry blood from the heart throughout the body. Hypertension forces the heart to work harder to pump blood and may cause arteries to become narrow or stiff. Untreated or uncontrolled hypertension can lead to a heart attack, heart failure, a stroke, kidney disease, and other problems. A blood pressure reading consists of a higher number over a lower number. Ideally, your blood pressure should be below 120/80. The first ("top") number is called the systolic pressure. It is a measure of the pressure in your arteries as your heart beats. The second ("bottom") number is called the diastolic pressure. It is a measure of the pressure in your arteries as the heart relaxes. What are the causes? The exact cause of this condition is not known. There are some conditions that result in high blood pressure. What increases the risk? Certain factors may make you more likely to develop high blood pressure. Some of these risk factors are under your control, including: Smoking. Not getting enough exercise or physical activity. Being overweight. Having too much fat, sugar, calories, or salt (sodium) in your diet. Drinking too much alcohol. Other risk factors include: Having a personal history of heart disease, diabetes, high cholesterol, or kidney disease. Stress. Having a family history of high blood pressure and high cholesterol. Having obstructive sleep apnea. Age. The risk increases with age. What are the signs or symptoms? High blood pressure may not cause symptoms. Very high blood pressure (hypertensive crisis) may cause: Headache. Fast or irregular heartbeats (palpitations). Shortness of breath. Nosebleed. Nausea and vomiting. Vision changes. Severe chest pain, dizziness, and seizures. How is this diagnosed? This condition is diagnosed by  measuring your blood pressure while you are seated, with your arm resting on a flat surface, your legs uncrossed, and your feet flat on the floor. The cuff of the blood pressure monitor will be placed directly against the skin of your upper arm at the level of your heart. Blood pressure should be measured at least twice using the same arm. Certain conditions can cause a difference in blood pressure between your right and left arms. If you have a high blood pressure reading during one visit or you have normal blood pressure with other risk factors, you may be asked to: Return on a different day to have your blood pressure checked again. Monitor your blood pressure at home for 1 week or longer. If you are diagnosed with hypertension, you may have other blood or imaging tests to help your health care provider understand your overall risk for other conditions. How is this treated? This condition is treated by making healthy lifestyle changes, such as eating healthy foods, exercising more, and reducing your alcohol intake. You may be referred for counseling on a healthy diet and physical activity. Your health care provider may prescribe medicine if lifestyle changes are not enough to get your blood pressure under control and if: Your systolic blood pressure is above 130. Your diastolic blood pressure is above 80. Your personal target blood pressure may vary depending on your medical conditions, your age, and other factors. Follow these instructions at home: Eating and drinking  Eat a diet that is high in fiber and potassium, and low in sodium, added sugar, and fat. An example of this eating plan is called the DASH diet. DASH stands for Dietary Approaches to Stop Hypertension. To eat this way: Eat  plenty of fresh fruits and vegetables. Try to fill one half of your plate at each meal with fruits and vegetables. Eat whole grains, such as whole-wheat pasta, brown rice, or whole-grain bread. Fill about one  fourth of your plate with whole grains. Eat or drink low-fat dairy products, such as skim milk or low-fat yogurt. Avoid fatty cuts of meat, processed or cured meats, and poultry with skin. Fill about one fourth of your plate with lean proteins, such as fish, chicken without skin, beans, eggs, or tofu. Avoid pre-made and processed foods. These tend to be higher in sodium, added sugar, and fat. Reduce your daily sodium intake. Many people with hypertension should eat less than 1,500 mg of sodium a day. Do not drink alcohol if: Your health care provider tells you not to drink. You are pregnant, may be pregnant, or are planning to become pregnant. If you drink alcohol: Limit how much you have to: 0-1 drink a day for women. 0-2 drinks a day for men. Know how much alcohol is in your drink. In the [place] one drink equals one 12 oz bottle of beer (355 mL), one 5 oz glass of wine (148 mL), or one 1 oz glass of hard liquor (44 mL). Lifestyle  Work with your health care provider to maintain a healthy body weight or to lose weight. Ask what an ideal weight is for you. Get at least 30 minutes of exercise that causes your heart to beat faster (aerobic exercise) most days of the week. Activities may include walking, swimming, or biking. Include exercise to strengthen your muscles (resistance exercise), such as Pilates or lifting weights, as part of your weekly exercise routine. Try to do these types of exercises for 30 minutes at least 3 days a week. Do not use any products that contain nicotine or tobacco. These products include cigarettes, chewing tobacco, and vaping devices, such as e-cigarettes. If you need help quitting, ask your health care provider. Monitor your blood pressure at home as told by your health care provider. Keep all follow-up visits. This is important. Medicines Take over-the-counter and prescription medicines only as told by your health care provider. Follow directions carefully. Blood  pressure medicines must be taken as prescribed. Do not skip doses of blood pressure medicine. Doing this puts you at risk for problems and can make the medicine less effective. Ask your health care provider about side effects or reactions to medicines that you should watch for. Contact a health care provider if you: Think you are having a reaction to a medicine you are taking. Have headaches that keep coming back (recurring). Feel dizzy. Have swelling in your ankles. Have trouble with your vision. Get help right away if you: Develop a severe headache or confusion. Have unusual weakness or numbness. Feel faint. Have severe pain in your chest or abdomen. Vomit repeatedly. Have trouble breathing. These symptoms may be an emergency. Get help right away. Call 911. Do not wait to see if the symptoms will go away. Do not drive yourself to the hospital. Summary Hypertension is when the force of blood pumping through your arteries is too strong. If this condition is not controlled, it may put you at risk for serious complications. Your personal target blood pressure may vary depending on your medical conditions, your age, and other factors. For most people, a normal blood pressure is less than 120/80. Hypertension is treated with lifestyle changes, medicines, or a combination of both. Lifestyle changes include losing weight, eating a healthy,  low-sodium diet, exercising more, and limiting alcohol. This information is not intended to replace advice given to you by your health care provider. Make sure you discuss any questions you have with your health care provider. Document Revised: 04/26/2021 Document Reviewed: 04/26/2021 Elsevier Patient Education  2024 [employer].

## 2024-04-17 NOTE — Progress Notes (Signed)
 Subjective:    Patient ID: Molly Snyder, female    DOB: February 16, 1961, 63 y.o.   MRN: [id number]  Chief Complaint  Patient presents with   Hypertension    Here for follow up   Anxiety    Here for follow up   Bloated    Patient complains of feeling bloated, some diarrhea at times    HPI Discussed the use of AI scribe software for clinical note transcription with the patient, who gave verbal consent to proceed.  History of Present Illness Molly Snyder is a 63 year old female who presents with gastrointestinal issues and anxiety.  Gastrointestinal symptoms have been ongoing since before the passing of her partner, Marvell. Her daily routine includes starting with coffee followed by a bowel movement that is less formed than usual. This is followed by two more bowel movements within the next two hours, which become progressively more liquid and mucousy. No blood in the stool unless she consumes red foods like beets, which can darken the stool. She has not had a colonoscopy despite changes in her bowel habits.  She has a history of anxiety, which she believes may be affecting her gastrointestinal symptoms. She is currently taking Lexapro  and occasionally uses alprazolam . She is considering whether her symptoms might be related to her statin medication. Her diet has improved since the initial stress of Marvell's passing and family drama, but she admits it could be better, especially since she is now single and less inclined to cook for herself. She acknowledges not drinking enough water, which could be contributing to her symptoms.  She is currently taking a high dose of vitamin D  once a week, as prescribed, but does not take a daily supplement. She has a history of smoking, which she associates with her anxiety. She has quit in the past but resumed smoking when she started seeing Marvell, who also smoked at the time. Smoking is a comfort mechanism tied to her anxiety.  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 She is single and has experienced significant stress following the passing of her partner, Marvell. No recent chest pain and has not needed to use nitroglycerin . Her weight is stable with a BMI of 25. No black or sticky stools, and there is no blood in her stool unless she consumes red foods.    Past Medical History:  Diagnosis Date   Allergic state 10/25/2014   Breast lesion 09/02/2014   Left axillae    Chest pain 09/02/2014   Hyperlipidemia, mixed 10/25/2014   Right bundle branch block    Snoring 09/02/2014   Tobacco use disorder 09/02/2014    Past Surgical History:  Procedure Laterality Date   pylonidal cystectomy      Family History  Problem Relation Age of Onset   Throat cancer Mother    Heart murmur Mother    Heart disease Father    Heart attack Father 30   Heart attack Paternal Grandfather    Heart attack Maternal Uncle    Colon cancer Neg Hx    Breast cancer Neg Hx     Social History   Socioeconomic History   Marital status: Single    Spouse name: Not on file   Number of children: 0   Years of education: Not on file   Highest education level: Not on file  Occupational History   Occupation: IV tech at [employer]  Tobacco Use   Smoking status: Some Days  Types: Cigarettes   Smokeless tobacco: Never  Vaping Use   Vaping status: Never Used  Substance and Sexual Activity   Alcohol use: Not Currently   Drug use: No   Sexual activity: Yes    Birth control/protection: None  Other Topics Concern   Not on file  Social History Narrative   Live by herself    Social Drivers of Corporate investment banker Strain: Not on file  Food Insecurity: Not on file  Transportation Needs: Not on file  Physical Activity: Not on file  Stress: Not on file  Social Connections: Not on file  Intimate Partner Violence: Not on file    Outpatient Medications Prior to Visit  Medication Sig Dispense Refill   ALPRAZolam  (XANAX ) 0.25 MG tablet Take 1 tablet  (0.25 mg total) by mouth 2 (two) times daily as needed for anxiety. 60 tablet 1   amLODipine  (NORVASC ) 2.5 MG tablet Take 1 tablet (2.5 mg total) by mouth daily. 90 tablet 1   aspirin  EC 81 MG tablet Take 1 tablet (81 mg total) by mouth daily.     atorvastatin  (LIPITOR) 40 MG tablet Take 1 tablet (40 mg total) by mouth at bedtime. 90 tablet 1   escitalopram  (LEXAPRO ) 10 MG tablet Take 1 tablet (10 mg total) by mouth daily. 90 tablet 1   ezetimibe  (ZETIA ) 10 MG tablet Take 1 tablet (10 mg total) by mouth daily. 90 tablet 3   loratadine (CLARITIN) 10 MG tablet Take 10 mg by mouth daily.     nitroGLYCERIN  (NITROSTAT ) 0.4 MG SL tablet Place 1 tablet (0.4 mg total) under the tongue every 5 (five) minutes x 3 doses as needed for chest pain. 25 tablet 3   Vitamin D , Ergocalciferol , (DRISDOL ) 1.25 MG (50000 UNIT) CAPS capsule Take 1 capsule (50,000 Units total) by mouth every 7 (seven) days. For 12 weeks 5 capsule 4   No facility-administered medications prior to visit.    Allergies  Allergen Reactions   Penicillins Anaphylaxis   Amoxicillin Rash    Review of Systems  Constitutional:  Positive for malaise/fatigue. Negative for fever.  HENT:  Negative for congestion.   Eyes:  Negative for blurred vision.  Respiratory:  Negative for shortness of breath.   Cardiovascular:  Negative for chest pain, palpitations and leg swelling.  Gastrointestinal:  Positive for diarrhea. Negative for abdominal pain, blood in stool, constipation, melena and nausea.  Genitourinary:  Negative for dysuria and frequency.  Musculoskeletal:  Negative for falls.  Skin:  Negative for rash.  Neurological:  Negative for dizziness, loss of consciousness and headaches.  Endo/Heme/Allergies:  Negative for environmental allergies.  Psychiatric/Behavioral:  Positive for depression. Negative for hallucinations and suicidal ideas. The patient is nervous/anxious.        Objective:    Physical Exam  BP 138/67 (BP Location:  Right Arm, Patient Position: Sitting, Cuff Size: Small)   Pulse 67   Temp 98.7 F (37.1 C) (Oral)   Resp 16   Ht 5' 6 (1.676 m)   Wt 155 lb (70.3 kg)   SpO2 97%   BMI 25.02 kg/m  Wt Readings from Last 3 Encounters:  04/17/24 155 lb (70.3 kg)  10/03/23 150 lb (68 kg)  11/30/22 145 lb (65.8 kg)    Diabetic Foot Exam - Simple   No data filed    Lab Results  Component Value Date   WBC 10.6 (H) 10/03/2023   HGB 14.6 10/03/2023   HCT 44.0 10/03/2023   PLT 445.0 (  H) 10/03/2023   GLUCOSE 93 10/03/2023   CHOL 241 (H) 10/03/2023   TRIG (H) 10/03/2023    405.0 Triglyceride is over 400; calculations on Lipids are invalid.   HDL 56.90 10/03/2023   LDLDIRECT 141.0 10/03/2023   LDLCALC 126 (H) 08/21/2022   ALT 20 10/03/2023   AST 19 10/03/2023   NA 139 10/03/2023   K 5.2 No hemolysis seen (H) 10/03/2023   CL 100 10/03/2023   CREATININE 0.79 10/03/2023   BUN 14 10/03/2023   CO2 28 10/03/2023   TSH 0.94 11/30/2022   HGBA1C 6.4 10/03/2023    Lab Results  Component Value Date   TSH 0.94 11/30/2022   Lab Results  Component Value Date   WBC 10.6 (H) 10/03/2023   HGB 14.6 10/03/2023   HCT 44.0 10/03/2023   MCV 91.7 10/03/2023   PLT 445.0 (H) 10/03/2023   Lab Results  Component Value Date   NA 139 10/03/2023   K 5.2 No hemolysis seen (H) 10/03/2023   CO2 28 10/03/2023   GLUCOSE 93 10/03/2023   BUN 14 10/03/2023   CREATININE 0.79 10/03/2023   BILITOT 0.5 10/03/2023   ALKPHOS 104 10/03/2023   AST 19 10/03/2023   ALT 20 10/03/2023   PROT 7.5 10/03/2023   ALBUMIN 4.6 10/03/2023   CALCIUM  10.3 10/03/2023   ANIONGAP 7 03/25/2022   GFR 80.12 10/03/2023   Lab Results  Component Value Date   CHOL 241 (H) 10/03/2023   Lab Results  Component Value Date   HDL 56.90 10/03/2023   Lab Results  Component Value Date   LDLCALC 126 (H) 08/21/2022   Lab Results  Component Value Date   TRIG (H) 10/03/2023    405.0 Triglyceride is over 400; calculations on Lipids are  invalid.   Lab Results  Component Value Date   CHOLHDL 4 10/03/2023   Lab Results  Component Value Date   HGBA1C 6.4 10/03/2023       Assessment & Plan:  Anxiety and depression  Hyperglycemia Assessment & Plan: hgba1c acceptable, minimize simple carbs. Increase exercise as tolerated.   Orders: -     Hemoglobin A1c  Familial hypercholesterolemia, unspecified type Assessment & Plan: Tolerating statin, encouraged heart healthy diet, avoid trans fats, minimize simple carbs and saturated fats. Increase exercise as tolerated  Orders: -     Lipid panel  Primary hypertension Assessment & Plan: Well controlled, no changes to meds. Encouraged heart healthy diet such as the DASH diet and exercise as tolerated.   Orders: -     Comprehensive metabolic panel with GFR -     CBC with Differential/Platelet -     TSH  Vitamin D  deficiency Assessment & Plan: Supplement and monitor   Orders: -     VITAMIN D  25 Hydroxy (Vit-D Deficiency, Fractures)  Colon cancer screening -     Ambulatory referral to Gastroenterology  CAD in native artery Assessment & Plan: No concerning symptoms recently, she is doing well     Assessment and Plan Assessment & Plan Change in bowel habits Chronic change in bowel habits with multiple bowel movements in the morning, progressing from less formed to more liquid and mucousy. No blood or melena. Likely related to stress and anxiety, but differential includes possible colon polyp. Statin side effect considered but unlikely. No colonoscopy performed to date. - Refer to gastroenterology for colonoscopy to evaluate change in bowel habits. - Advise dietary modifications including increased fiber intake and consideration of probiotics. - Encourage adequate hydration  and regular fluid intake.  Anxiety disorder Anxiety potentially affecting bowel habits. Currently managed with Lexapro  and alprazolam  as needed. No current desire to increase Lexapro   dosage. - Continue current anxiety management with Lexapro  and alprazolam  as needed. - Offer option to increase Lexapro  if anxiety symptoms worsen.  Nicotine dependence Continues to smoke, with smoking tied to anxiety and used as a coping mechanism. Acknowledges previous quit attempts and challenges with staying quit. Each quit attempt statistically increases the likelihood of success. - Encourage smoking cessation and offer support including prescriptions for patches or gum if desired. - Discuss available resources for smoking cessation through employee support systems.  Mixed hyperlipidemia Currently on atorvastatin  and Zetia . No recent issues with chest pain. - Order blood work to assess lipid levels.  Essential hypertension Blood pressure well-controlled on current regimen. No recent chest pain requiring nitroglycerin .  Vitamin D  deficiency Currently on high-dose vitamin D  weekly. Plan to transition to steady state supplementation to maintain bone health. - Check vitamin D  levels today. - Consider adding daily vitamin D  supplementation based on current levels. - Plan to transition from high-dose to daily supplementation once levels are stable.  General Health Maintenance Up to date on flu and tetanus vaccinations. Shingles vaccination received but not documented in chart. Discussion of RSV vaccination and its importance in older adults. - Request documentation of shingles vaccination for medical records. - Consider RSV vaccination, especially by age 63.  Recording duration: 20 minutes     Harlene Horton, MD

## 2024-04-17 NOTE — Assessment & Plan Note (Signed)
 No concerning symptoms recently, she is doing well

## 2024-04-17 NOTE — Assessment & Plan Note (Signed)
 Supplement and monitor

## 2024-04-17 NOTE — Assessment & Plan Note (Signed)
 Well controlled, no changes to meds. Encouraged heart healthy diet such as the DASH diet and exercise as tolerated.

## 2024-04-17 NOTE — Assessment & Plan Note (Signed)
 hgba1c acceptable, minimize simple carbs. Increase exercise as tolerated.

## 2024-04-17 NOTE — Assessment & Plan Note (Signed)
 Tolerating statin, encouraged heart healthy diet, avoid trans fats, minimize simple carbs and saturated fats. Increase exercise as tolerated

## 2024-04-18 ENCOUNTER — Other Ambulatory Visit: Payer: Self-pay

## 2024-04-18 ENCOUNTER — Ambulatory Visit: Payer: Self-pay | Admitting: Family Medicine

## 2024-04-18 ENCOUNTER — Other Ambulatory Visit (HOSPITAL_BASED_OUTPATIENT_CLINIC_OR_DEPARTMENT_OTHER): Payer: Self-pay

## 2024-04-18 MED ORDER — ESCITALOPRAM OXALATE 10 MG PO TABS
10.0000 mg | ORAL_TABLET | Freq: Every day | ORAL | 1 refills | Status: AC
  Filled 2024-04-18: qty 90, 90d supply, fill #0

## 2024-05-08 ENCOUNTER — Telehealth: Payer: Self-pay

## 2024-05-08 NOTE — Telephone Encounter (Signed)
 Called patient to schedule new patient appointment. Left voicemail with our contact information to call back and schedule.

## 2024-11-03 ENCOUNTER — Encounter: Admitting: Family Medicine
